# Patient Record
Sex: Male | Born: 1960 | Race: White | Hispanic: No | Marital: Single | Smoking: Never smoker
Health system: Southern US, Community
[De-identification: ages and names within clinical notes are randomized; demographics above are authoritative.]

## PROBLEM LIST (undated history)

## (undated) DIAGNOSIS — E1069 Type 1 diabetes mellitus with other specified complication: Secondary | ICD-10-CM

## (undated) DIAGNOSIS — E785 Hyperlipidemia, unspecified: Secondary | ICD-10-CM

## (undated) DIAGNOSIS — E119 Type 2 diabetes mellitus without complications: Secondary | ICD-10-CM

## (undated) DIAGNOSIS — F988 Other specified behavioral and emotional disorders with onset usually occurring in childhood and adolescence: Secondary | ICD-10-CM

## (undated) DIAGNOSIS — E1029 Type 1 diabetes mellitus with other diabetic kidney complication: Secondary | ICD-10-CM

## (undated) DIAGNOSIS — M4802 Spinal stenosis, cervical region: Secondary | ICD-10-CM

## (undated) HISTORY — DX: Spinal stenosis, cervical region: M48.02

## (undated) HISTORY — DX: Other specified behavioral and emotional disorders with onset usually occurring in childhood and adolescence: F98.8

## (undated) HISTORY — DX: Type 2 diabetes mellitus without complications: E11.9

## (undated) HISTORY — PX: MOUTH SURGERY: SHX715

## (undated) HISTORY — DX: Type 1 diabetes mellitus with other specified complication: E10.69

## (undated) HISTORY — DX: Type 1 diabetes mellitus with other diabetic kidney complication: E10.29

## (undated) HISTORY — DX: Hyperlipidemia, unspecified: E78.5

---

## 2000-02-24 ENCOUNTER — Encounter: Admission: RE | Admit: 2000-02-24 | Discharge: 2000-05-24 | Payer: Self-pay | Admitting: Internal Medicine

## 2009-04-03 HISTORY — PX: REFRACTIVE SURGERY: SHX103

## 2009-06-03 HISTORY — PX: NASAL SINUS SURGERY: SHX719

## 2009-09-03 HISTORY — PX: SHOULDER SURGERY: SHX246

## 2010-11-04 DIAGNOSIS — E109 Type 1 diabetes mellitus without complications: Secondary | ICD-10-CM | POA: Insufficient documentation

## 2013-03-10 ENCOUNTER — Other Ambulatory Visit: Payer: Self-pay

## 2013-03-10 MED ORDER — AMPHETAMINE-DEXTROAMPHETAMINE 20 MG PO TABS: 20.0000 mg | ORAL_TABLET | Freq: Two times a day (BID) | ORAL | Status: DC

## 2013-03-10 NOTE — Telephone Encounter (Signed)
Patient called requesting refill on Adderall.  Patient would like rx mailed when it is ready.

## 2013-04-29 ENCOUNTER — Other Ambulatory Visit: Payer: Self-pay

## 2013-04-29 MED ORDER — AMPHETAMINE-DEXTROAMPHETAMINE 20 MG PO TABS: 20.0000 mg | ORAL_TABLET | Freq: Two times a day (BID) | ORAL | Status: DC

## 2013-04-29 NOTE — Telephone Encounter (Signed)
Patient called to request a refill on Adderall.  He would like the Rx mailed to him when ready.

## 2013-04-30 NOTE — Telephone Encounter (Signed)
Rx Mailed.

## 2013-06-03 ENCOUNTER — Other Ambulatory Visit: Payer: Self-pay

## 2013-06-03 MED ORDER — AMPHETAMINE-DEXTROAMPHETAMINE 20 MG PO TABS: 20.0000 mg | ORAL_TABLET | Freq: Two times a day (BID) | ORAL | Status: DC

## 2013-06-03 NOTE — Telephone Encounter (Signed)
Patient called, left message requesting a refill on Adderall.  He would like the Rx mailed to him when it's ready.

## 2013-06-14 ENCOUNTER — Encounter: Payer: Self-pay | Admitting: Nurse Practitioner

## 2013-06-16 ENCOUNTER — Ambulatory Visit: Payer: Self-pay | Admitting: Nurse Practitioner

## 2013-07-01 ENCOUNTER — Other Ambulatory Visit: Payer: Self-pay

## 2013-07-01 MED ORDER — AMPHETAMINE-DEXTROAMPHETAMINE 20 MG PO TABS: 20.0000 mg | ORAL_TABLET | Freq: Two times a day (BID) | ORAL | Status: DC

## 2013-07-01 NOTE — Telephone Encounter (Signed)
Patient called requesting a refill on Adderall.  He would like the Rx mailed to him when it's ready.  

## 2013-07-02 NOTE — Telephone Encounter (Signed)
Rx Mailed.

## 2013-08-05 ENCOUNTER — Other Ambulatory Visit: Payer: Self-pay

## 2013-08-05 MED ORDER — AMPHETAMINE-DEXTROAMPHETAMINE 20 MG PO TABS: 20.0000 mg | ORAL_TABLET | Freq: Two times a day (BID) | ORAL | Status: DC

## 2013-08-05 NOTE — Telephone Encounter (Signed)
Patient called requesting a refill on Adderall.  He would like the Rx mailed to his new address [address] [place] [address] when it's ready.

## 2013-08-05 NOTE — Telephone Encounter (Signed)
Rx signed and mailed.

## 2013-09-16 ENCOUNTER — Other Ambulatory Visit: Payer: Self-pay

## 2013-09-16 MED ORDER — AMPHETAMINE-DEXTROAMPHETAMINE 20 MG PO TABS: 20.0000 mg | ORAL_TABLET | Freq: Two times a day (BID) | ORAL | Status: DC

## 2013-09-16 NOTE — Telephone Encounter (Signed)
Rx signed and mailed.

## 2013-09-16 NOTE — Telephone Encounter (Signed)
Patient called requesting a refill on Adderall.  He would like the Rx mailed to him when it's ready.  

## 2013-10-23 ENCOUNTER — Other Ambulatory Visit: Payer: Self-pay

## 2013-10-23 MED ORDER — AMPHETAMINE-DEXTROAMPHETAMINE 20 MG PO TABS: 20.0000 mg | ORAL_TABLET | Freq: Two times a day (BID) | ORAL | Status: DC

## 2013-10-23 NOTE — Telephone Encounter (Signed)
Patient called requesting a refill on Adderall.  He would like the Rx mailed to him when it's ready.  

## 2013-10-24 NOTE — Telephone Encounter (Signed)
Patient's prescription was mailed out today. °

## 2013-12-04 HISTORY — PX: FOOT FRACTURE SURGERY: SHX645

## 2013-12-10 ENCOUNTER — Other Ambulatory Visit: Payer: Self-pay

## 2013-12-10 MED ORDER — AMPHETAMINE-DEXTROAMPHETAMINE 20 MG PO TABS: 20.0000 mg | ORAL_TABLET | Freq: Two times a day (BID) | ORAL | Status: DC

## 2013-12-10 NOTE — Telephone Encounter (Signed)
Patient called requesting a refill on Adderall.  He would ike the Rx mailed when it is ready.

## 2013-12-11 NOTE — Telephone Encounter (Signed)
Rx was mailed out today.

## 2014-01-15 ENCOUNTER — Other Ambulatory Visit: Payer: Self-pay | Admitting: *Deleted

## 2014-01-15 MED ORDER — AMPHETAMINE-DEXTROAMPHETAMINE 20 MG PO TABS: 20.0000 mg | ORAL_TABLET | Freq: Two times a day (BID) | ORAL | Status: DC

## 2014-01-15 NOTE — Telephone Encounter (Signed)
Patient's Rx was mailed out today. I tried to call to inform the patient but # was not in service and patient was not at work today.

## 2014-03-23 ENCOUNTER — Other Ambulatory Visit: Payer: Self-pay | Admitting: *Deleted

## 2014-03-23 MED ORDER — AMPHETAMINE-DEXTROAMPHETAMINE 20 MG PO TABS: 20.0000 mg | ORAL_TABLET | Freq: Two times a day (BID) | ORAL | Status: DC

## 2014-03-23 NOTE — Telephone Encounter (Signed)
Patient called needing refill ofamphetamine-dextroamphetamine (ADDERALL) 20 MG tablet.  Patient states rx is normally mailed to his home.  Please call patient at earliest convenience.  Thanks

## 2014-03-23 NOTE — Telephone Encounter (Signed)
Pt's Rx was mailed out today. °

## 2014-03-23 NOTE — Telephone Encounter (Signed)
Has appt scheduled in June.

## 2014-04-13 ENCOUNTER — Encounter: Payer: Self-pay | Admitting: Nurse Practitioner

## 2014-05-19 ENCOUNTER — Ambulatory Visit: Payer: Self-pay | Admitting: Nurse Practitioner

## 2014-05-28 ENCOUNTER — Other Ambulatory Visit: Payer: Self-pay | Admitting: Nurse Practitioner

## 2014-05-28 MED ORDER — AMPHETAMINE-DEXTROAMPHETAMINE 20 MG PO TABS: 20.0000 mg | ORAL_TABLET | Freq: Two times a day (BID) | ORAL | Status: DC

## 2014-05-28 NOTE — Telephone Encounter (Signed)
Patient needing refill for amphetamine-dextroamphetamine (ADDERALL) 20 MG tablet.  Thanks

## 2014-05-28 NOTE — Telephone Encounter (Signed)
Patient requesting Rx mailed to home and to call when mailed.  Thanks

## 2014-05-28 NOTE — Telephone Encounter (Signed)
Patient has an appt in August  Request forwarded to Arasely Akkerman for approval

## 2014-05-29 NOTE — Telephone Encounter (Signed)
Pt's Rx was mailed out today. °

## 2014-06-29 ENCOUNTER — Telehealth: Payer: Self-pay | Admitting: *Deleted

## 2014-06-29 NOTE — Telephone Encounter (Signed)
Calling patient to r/s appointment, home phone was disconnected, tried work number left message with [name] to have him call us back to r/s his appointment on 07/07/14.

## 2014-07-01 ENCOUNTER — Encounter: Payer: Self-pay | Admitting: *Deleted

## 2014-07-01 NOTE — Telephone Encounter (Signed)
Tried calling patient again to r/s appointment, no response letter has been mailed out to the patient to call the office back and r/s his appointment with NP CM.

## 2014-07-02 ENCOUNTER — Telehealth: Payer: Self-pay | Admitting: *Deleted

## 2014-07-02 NOTE — Telephone Encounter (Signed)
Called patient again today, spoke with Spencer Burke at his job and was given a mobile number of [phone], tried calling this number left message to call the office back about his appointment on 07/07/14, appointment needs to be r/s.

## 2014-07-06 ENCOUNTER — Encounter: Payer: Self-pay | Admitting: *Deleted

## 2014-07-07 ENCOUNTER — Ambulatory Visit: Payer: Self-pay | Admitting: Nurse Practitioner

## 2014-07-08 NOTE — Telephone Encounter (Signed)
Patient never returned the call and did not show up for his appointment.

## 2014-07-16 ENCOUNTER — Other Ambulatory Visit: Payer: Self-pay | Admitting: Nurse Practitioner

## 2014-07-16 MED ORDER — AMPHETAMINE-DEXTROAMPHETAMINE 20 MG PO TABS: 20.0000 mg | ORAL_TABLET | Freq: Two times a day (BID) | ORAL | Status: DC

## 2014-07-16 NOTE — Telephone Encounter (Signed)
°  Patient requesting refill of Adderall  sent to his home  Best number to contact [phone] okay to leave a message

## 2014-07-16 NOTE — Telephone Encounter (Signed)
Patient has an appt scheduled in Nov.  I called and spoke with patient who says he will be in for appt in Nov.  Dr Anne HahnWillis is out of the office.  [facility].

## 2014-07-17 NOTE — Telephone Encounter (Signed)
Pt's Rx was mailed out today. °

## 2014-08-27 ENCOUNTER — Telehealth: Payer: Self-pay | Admitting: Neurology

## 2014-08-27 NOTE — Telephone Encounter (Signed)
Patient calling to request refill of generic Adderall, says he usually gets it mailed to him. Please call and advise.

## 2014-08-28 NOTE — Telephone Encounter (Signed)
This patient has not been see in > 1 year. He will need a RV.

## 2014-08-28 NOTE — Telephone Encounter (Signed)
Please advise previous note. Thanks  °

## 2014-09-03 ENCOUNTER — Encounter: Payer: Self-pay | Admitting: Adult Health

## 2014-09-04 ENCOUNTER — Ambulatory Visit: Payer: Self-pay | Admitting: Adult Health

## 2014-09-08 ENCOUNTER — Encounter: Payer: Self-pay | Admitting: Adult Health

## 2014-09-08 ENCOUNTER — Encounter (INDEPENDENT_AMBULATORY_CARE_PROVIDER_SITE_OTHER): Payer: Self-pay

## 2014-09-08 ENCOUNTER — Ambulatory Visit (INDEPENDENT_AMBULATORY_CARE_PROVIDER_SITE_OTHER): Payer: Managed Care, Other (non HMO) | Admitting: Adult Health

## 2014-09-08 VITALS — BP 116/63 | HR 61 | Ht 70.0 in | Wt 175.0 lb

## 2014-09-08 DIAGNOSIS — F988 Other specified behavioral and emotional disorders with onset usually occurring in childhood and adolescence: Secondary | ICD-10-CM | POA: Insufficient documentation

## 2014-09-08 DIAGNOSIS — F909 Attention-deficit hyperactivity disorder, unspecified type: Secondary | ICD-10-CM

## 2014-09-08 MED ORDER — AMPHETAMINE-DEXTROAMPHETAMINE 20 MG PO TABS: 20.0000 mg | ORAL_TABLET | Freq: Two times a day (BID) | ORAL | Status: DC

## 2014-09-08 NOTE — Patient Instructions (Signed)
Amphetamine; Dextroamphetamine tablets What is this medicine? AMPHETAMINE; DEXTROAMPHETAMINE(am FET a meen; dex troe am FET a meen) is used to treat attention-deficit hyperactivity disorder (ADHD). It may also be used for narcolepsy. Federal law prohibits giving this medicine to any person other than the person for whom it was prescribed. Do not share this medicine with anyone else. This medicine may be used for other purposes; ask your health care provider or pharmacist if you have questions. COMMON BRAND NAME(S): Adderall What should I tell my health care provider before I take this medicine? They need to know if you have any of these conditions: -anxiety or panic attacks -circulation problems in fingers and toes -glaucoma -hardening or blockages of the arteries or heart blood vessels -heart disease or a heart defect -high blood pressure -history of a drug or alcohol abuse problem -history of stroke -kidney disease -liver disease -mental illness -seizures -suicidal thoughts, plans, or attempt; a previous suicide attempt by you or a family member -thyroid disease -Tourette's syndrome -an unusual or allergic reaction to dextroamphetamine, other amphetamines, other medicines, foods, dyes, or preservatives -pregnant or trying to get pregnant -breast-feeding How should I use this medicine? Take this medicine by mouth with a glass of water. Follow the directions on the prescription label. Take your doses at regular intervals. Do not take your medicine more often than directed. Do not suddenly stop your medicine. You must gradually reduce the dose or you may feel withdrawal effects. Ask your doctor or health care professional for advice. Talk to your pediatrician regarding the use of this medicine in children. Special care may be needed. While this drug may be prescribed for children as young as 3 years for selected conditions, precautions do apply. Overdosage: If you think you have taken too  much of this medicine contact a poison control center or emergency room at once. NOTE: This medicine is only for you. Do not share this medicine with others. What if I miss a dose? If you miss a dose, take it as soon as you can. If it is almost time for your next dose, take only that dose. Do not take double or extra doses. What may interact with this medicine? Do not take this medicine with any of the following medications: -certain medicines for migraine headache like almotriptan, eletriptan, frovatriptan, naratriptan, rizatriptan, sumatriptan, zolmitriptan -MAOIS like Carbex, Eldepryl, Marplan, Nardil, and Parnate -meperidine -other stimulant medicines for attention disorders, weight loss, or to stay awake -pimozide -procarbazine This medicine may also interact with the following medications: -acetazolamide -ammonium chloride -antacids -ascorbic acid -atomoxetine -caffeine -certain medicines for blood pressure -certain medicines for depression, anxiety, or psychotic disturbances -certain medicines for seizures like carbamazepine, phenobarbital, phenytoin -certain medicines for stomach problems like cimetidine, famotidine, omeprazole, lansoprazole -cold or allergy medicines -glutamic acid -methenamine -narcotic medicines for pain -norepinephrine -phenothiazines like chlorpromazine, mesoridazine, prochlorperazine, thioridazine -sodium acid phosphate -sodium bicarbonate This list may not describe all possible interactions. Give your health care provider a list of all the medicines, herbs, non-prescription drugs, or dietary supplements you use. Also tell them if you smoke, drink alcohol, or use illegal drugs. Some items may interact with your medicine. What should I watch for while using this medicine? Visit your doctor or health care professional for regular checks on your progress. This prescription requires that you follow special procedures with your doctor and pharmacy. You will  need to have a new written prescription from your doctor every time you need a refill. This medicine may affect your   concentration, or hide signs of tiredness. Until you know how this medicine affects you, do not drive, ride a bicycle, use machinery, or do anything that needs mental alertness. Tell your doctor or health care professional if this medicine loses its effects, or if you feel you need to take more than the prescribed amount. Do not change the dosage without talking to your doctor or health care professional. Decreased appetite is a common side effect when starting this medicine. Eating small, frequent meals or snacks can help. Talk to your doctor if you continue to have poor eating habits. Height and weight growth of a child taking this medicine will be monitored closely. Do not take this medicine close to bedtime. It may prevent you from sleeping. If you are going to need surgery, a MRI, CT scan, or other procedure, tell your doctor that you are taking this medicine. You may need to stop taking this medicine before the procedure. Tell your doctor or healthcare professional right away if you notice unexplained wounds on your fingers and toes while taking this medicine. You should also tell your healthcare provider if you experience numbness or pain, changes in the skin color, or sensitivity to temperature in your fingers or toes. What side effects may I notice from receiving this medicine? Side effects that you should report to your doctor or health care professional as soon as possible: -allergic reactions like skin rash, itching or hives, swelling of the face, lips, or tongue -changes in vision -chest pain or chest tightness -confusion, trouble speaking or understanding -fast, irregular heartbeat -fingers or toes feel numb, cool, painful -hallucination, loss of contact with reality -high blood pressure -males: prolonged or painful erection -seizures -severe headaches -shortness of  breath -suicidal thoughts or other mood changes -trouble walking, dizziness, loss of balance or coordination -uncontrollable head, mouth, neck, arm, or leg movements Side effects that usually do not require medical attention (report to your doctor or health care professional if they continue or are bothersome): -anxious -headache -loss of appetite -nausea, vomiting -trouble sleeping -weight loss This list may not describe all possible side effects. Call your doctor for medical advice about side effects. You may report side effects to FDA at 1-800-FDA-1088. Where should I keep my medicine? Keep out of the reach of children. This medicine can be abused. Keep your medicine in a safe place to protect it from theft. Do not share this medicine with anyone. Selling or giving away this medicine is dangerous and against the law. Store at room temperature between 15 and 30 degrees C (59 and 86 degrees F). Keep container tightly closed. Throw away any unused medicine after the expiration date. NOTE: This sheet is a summary. It may not cover all possible information. If you have questions about this medicine, talk to your doctor, pharmacist, or health care provider.  2015, Elsevier/Gold Standard. (2013-10-03 18:16:55)  

## 2014-09-08 NOTE — Progress Notes (Signed)
PATIENT: Spencer Burke DOB: 09-07-1961  REASON FOR VISIT: follow up HISTORY FROM: patient  HISTORY OF PRESENT ILLNESS: Spencer Burke is a 53 year old male with a history of memory loss, ADD, polyneuropathy. He returns today for follow-up. He is currently taking adderall. Patient states that he had neuropsychological testing over 15 years ago that suggested that he might have learning disorder. Therefore he was started on adderall. He feels that the adderall has helped. His previous MOCA test was 25/30 in 2013. He does have a mother and uncle that has alzheimer's diease. He feels that his memory loss has remained the same. 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 He has good performance on the job. He does write articles but notices he has more difficulty writing his thoughts vs. Speaking them. He states that this is ongoing but denies it getting worse.   REVIEW OF SYSTEMS: Full 14 system review of systems performed and notable only for:  Constitutional: fatigue Eyes: blurred vision Ear/Nose/Throat: N/A  Skin: N/A  Cardiovascular: N/A  Respiratory: N/A  Gastrointestinal: N/A  Genitourinary: N/A Hematology/Lymphatic: N/A  Endocrine: N/A Musculoskeletal:N/A  Allergy/Immunology: N/A  Neurological: memory loss Psychiatric: decreased concentration Sleep: N/A   ALLERGIES: Allergies  Allergen Reactions  . Tetracyclines & Related     HOME MEDICATIONS: Outpatient Prescriptions Prior to Visit  Medication Sig Dispense Refill  . amphetamine-dextroamphetamine (ADDERALL) 20 MG tablet Take 1 tablet (20 mg total) by mouth 2 (two) times daily.  60 tablet  0  . aspirin 81 MG tablet Take 81 mg by mouth daily.      . Insulin Human (INSULIN PUMP) SOLN by Intravenous (Continuous Infusion) route. 5 units, Basal rate continous      . lisinopril (PRINIVIL,ZESTRIL) 10 MG tablet Take 10 mg by mouth daily.       No facility-administered  medications prior to visit.    PAST MEDICAL HISTORY: Past Medical History  Diagnosis Date  . Diabetes   . ADD (attention deficit disorder)   . HTN (hypertension)     PAST SURGICAL HISTORY: Past Surgical History  Procedure Laterality Date  . Shoulder surgery Left 09/2009  . Refractive surgery  04/2009  . Nasal sinus surgery  06/2009  . Mouth surgery      FAMILY HISTORY: Family History  Problem Relation Age of Onset  . Transient ischemic attack Mother   . Transient ischemic attack Father     SOCIAL HISTORY: History   Social History  . Marital Status: Single    Spouse Name: N/A    Number of Children: 2  . Years of Education: 12+   Occupational History  . Not on file.   Social History Main Topics  . Smoking status: Never Smoker   . Smokeless tobacco: Never Used  . Alcohol Use: No  . Drug Use: No  . Sexual Activity: Not on file   Other Topics Concern  . Not on file   Social History Narrative   Patient lives at home with wife.   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    Patient has 2 adopted children.    Patient is right handed.   Patient drinks 2 cups of caffeine daily.       PHYSICAL EXAM  Filed Vitals:   09/08/14 1127  BP: 116/63  Pulse: 61  Height: 5\' 10"  (1.778 m)  Weight: 175 lb (79.379 kg)   Body  mass index is 25.11 kg/(m^2).  Generalized: Well developed, in no acute distress   Neurological examination  Mentation: Alert oriented to time, place, history taking. Follows all commands speech and language fluent. MOCA 27/30 Cranial nerve II-XII: Pupils were equal round reactive to light. Extraocular movements were full, visual field were full on confrontational test. Facial sensation and strength were normal.Uvula tongue midline. Head turning and shoulder shrug  were normal and symmetric. Motor: The motor testing reveals 5 over 5 strength of all 4 extremities. Good symmetric motor tone is noted throughout.  Sensory: Sensory testing is intact  to soft touch on all 4 extremities. No evidence of extinction is noted.  Coordination: Cerebellar testing reveals good finger-nose-finger and heel-to-shin bilaterally.  Gait and station: Gait is normal. Tandem gait is normal. Romberg is negative. No drift is seen.  Reflexes: Deep tendon reflexes are symmetric and normal bilaterally.    DIAGNOSTIC DATA (LABS, IMAGING, TESTING) - I reviewed patient records, labs, notes, testing and imaging myself where available.   ASSESSMENT AND PLAN 53 y.o. year old male  has a past medical history of Diabetes; ADD (attention deficit disorder); and HTN (hypertension). here with:  1. attention deficit disorder  Patient was last seen in 2013. He was prescribed Adderall for attention deficit disorder after neuropsychological testing indicated that he may have a learning disorder. He does get benefit from the Adderall. I will refill this today. His moca score has improved since 2013. His score today was 27/30. We will continue to follow his memory over time. If the patient's symptoms worsen or he develops new symptoms he should let us know. Otherwise he will followup in 6 months or sooner if needed.   Spencer PennyMegan Kwadwo Taras, MSN, NP-C 09/08/2014, 11:47 AM [facility] [address], [address] [place], [place] [address] [phone]  Note: This document was prepared with digital dictation and possible smart phrase technology. Any transcriptional errors that result from this process are unintentional.

## 2014-09-08 NOTE — Progress Notes (Signed)
I have read the note, and I agree with the clinical assessment and plan.  Ambra Haverstick KEITH   

## 2014-10-06 ENCOUNTER — Ambulatory Visit: Payer: Self-pay | Admitting: Nurse Practitioner

## 2014-10-27 ENCOUNTER — Other Ambulatory Visit: Payer: Self-pay | Admitting: Neurology

## 2014-10-27 MED ORDER — AMPHETAMINE-DEXTROAMPHETAMINE 20 MG PO TABS: 20.0000 mg | ORAL_TABLET | Freq: Two times a day (BID) | ORAL | Status: DC

## 2014-10-27 NOTE — Telephone Encounter (Signed)
I called the patient to let them know their Rx for Adderall was ready for pickup. Patient was instructed to bring Photo ID.  Patient states that his Dad, Gerilyn NestleLem Hulse  DOB: [birth year]/33 will be picking up his Rx.  The front desk was notified.

## 2014-10-27 NOTE — Telephone Encounter (Signed)
Pt needs writted Rx for amphetamine-dextroamphetamine (ADDERALL) 20 MG tablet.  Patient states this is mailed to him.  Please cal and advise.

## 2014-10-27 NOTE — Telephone Encounter (Signed)
Request entered, forwarded to provider for approval.  

## 2014-12-15 ENCOUNTER — Other Ambulatory Visit: Payer: Self-pay | Admitting: Adult Health

## 2014-12-15 MED ORDER — AMPHETAMINE-DEXTROAMPHETAMINE 20 MG PO TABS: 20.0000 mg | ORAL_TABLET | Freq: Two times a day (BID) | ORAL | Status: DC

## 2014-12-15 NOTE — Telephone Encounter (Signed)
Request entered, forwarded to provider for approval.  

## 2014-12-15 NOTE — Telephone Encounter (Addendum)
I called the patient to let them know their Rx for Adderall was ready for pickup. Patient was instructed to bring Photo ID.  Spencer Burke, DOB [birth year] will be picking up the patients Rx per patients request.

## 2014-12-15 NOTE — Telephone Encounter (Signed)
Patient requesting Rx refill for amphetamine-dextroamphetamine (ADDERALL) 20 MG tablet and requesting Generic brand.  Please call when ready for pick up.

## 2015-02-08 ENCOUNTER — Other Ambulatory Visit: Payer: Self-pay | Admitting: Adult Health

## 2015-02-08 MED ORDER — AMPHETAMINE-DEXTROAMPHETAMINE 20 MG PO TABS: 20.0000 mg | ORAL_TABLET | Freq: Two times a day (BID) | ORAL | Status: DC

## 2015-02-08 NOTE — Telephone Encounter (Signed)
Request entered, forwarded to provider for approval.  

## 2015-02-08 NOTE — Telephone Encounter (Signed)
Patient requesting Rx refill for amphetamine-dextroamphetamine (ADDERALL) 20 MG tablet.  Requesting if he could pick up Rx on 3/8 [place] to [place]  Please call and advise.

## 2015-02-09 ENCOUNTER — Telehealth: Payer: Self-pay

## 2015-02-09 NOTE — Telephone Encounter (Signed)
Called patient and informed Rx ready for pick up at front desk. Patient verbalized understanding.  

## 2015-03-11 ENCOUNTER — Ambulatory Visit: Payer: Managed Care, Other (non HMO) | Admitting: Adult Health

## 2015-03-18 ENCOUNTER — Telehealth: Payer: Self-pay

## 2015-03-18 NOTE — Telephone Encounter (Signed)
Called patient and Cx his apt. With Megan 03-19-15. Explained to patient why he understood. Patient will call back to schedule. Patient can see Dr.Willis/CM  due to Megan's LOA. Patient in centricity .

## 2015-03-19 ENCOUNTER — Ambulatory Visit: Payer: Managed Care, Other (non HMO) | Admitting: Adult Health

## 2015-04-15 ENCOUNTER — Ambulatory Visit (INDEPENDENT_AMBULATORY_CARE_PROVIDER_SITE_OTHER): Payer: Managed Care, Other (non HMO) | Admitting: Nurse Practitioner

## 2015-04-15 ENCOUNTER — Encounter: Payer: Self-pay | Admitting: Nurse Practitioner

## 2015-04-15 VITALS — BP 118/70 | HR 68 | Ht 69.0 in | Wt 175.2 lb

## 2015-04-15 DIAGNOSIS — F909 Attention-deficit hyperactivity disorder, unspecified type: Secondary | ICD-10-CM

## 2015-04-15 DIAGNOSIS — F988 Other specified behavioral and emotional disorders with onset usually occurring in childhood and adolescence: Secondary | ICD-10-CM

## 2015-04-15 MED ORDER — AMPHETAMINE-DEXTROAMPHETAMINE 20 MG PO TABS: 20.0000 mg | ORAL_TABLET | Freq: Two times a day (BID) | ORAL | Status: DC

## 2015-04-15 NOTE — Patient Instructions (Signed)
Verified Adderall prescription with [facility] Rx Adderall 20 mg twice daily Follow-up in 6 months

## 2015-04-15 NOTE — Progress Notes (Signed)
[facility]  PATIENT: Spencer Burke DOB: 11-24-1961   REASON FOR VISIT: Follow-up for attention deficit disorder  HISTORY FROM: Patient    HISTORY OF PRESENT ILLNESS:[name] is a 54 year old male with a history of memory loss, ADD, polyneuropathy. He returns today for follow-up. He was last seen in this office by Spencer PennyMegan Millikan, NP 09/08/2014. He is currently taking adderall. Patient states that he had neuropsychological testing over 15 years ago that suggested that he might have learning disorder. Therefore he was started on adderall. He feels that the adderall has helped. His previous MOCA test was 25/30 in 2013. He does have a mother and uncle that has alzheimer's diease. He feels that his memory loss has remained the same. 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 He has good performance on the job. He does write articles but notices he has more difficulty writing his thoughts vs. Speaking them. He states that this is ongoing but denies it getting worse. He also is diabetic and has insulin pump. He returns for reevaluation. He is unsure of his Adderall dose and there are 2 different  doses in the computer    REVIEW OF SYSTEMS: Full 14 system review of systems performed and notable only for those listed, all others are neg:  Constitutional: neg  Cardiovascular: neg Ear/Nose/Throat: neg  Skin: neg Eyes: neg Respiratory: neg Gastroitestinal: neg  Hematology/Lymphatic: neg  Endocrine: neg Musculoskeletal:neg Allergy/Immunology: neg Neurological: Memory loss, ADD Psychiatric: neg Sleep : neg   ALLERGIES: Allergies  Allergen Reactions  . Tetracyclines & Related     HOME MEDICATIONS: Outpatient Prescriptions Prior to Visit  Medication Sig Dispense Refill  . aspirin 81 MG tablet Take 81 mg by mouth daily.    . Insulin Human (INSULIN PUMP) SOLN by Intravenous (Continuous Infusion) route. 5 units,  Basal rate continous    . lisinopril (PRINIVIL,ZESTRIL) 10 MG tablet Take 10 mg by mouth daily.    [name]. amphetamine-dextroamphetamine (ADDERALL) 20 MG tablet Take 1 tablet (20 mg total) by mouth 2 (two) times daily. (Patient not taking: Reported on 04/15/2015) 60 tablet 0   No facility-administered medications prior to visit.    PAST MEDICAL HISTORY: Past Medical History  Diagnosis Date  . Diabetes   . ADD (attention deficit disorder)     PAST SURGICAL HISTORY: Past Surgical History  Procedure Laterality Date  . Shoulder surgery Left 09/2009  . Refractive surgery  04/2009  . Nasal sinus surgery  06/2009  . Mouth surgery      FAMILY HISTORY: Family History  Problem Relation Age of Onset  . Transient ischemic attack Mother   . Transient ischemic attack Father     SOCIAL HISTORY: History   Social History  . Marital Status: Single    Spouse Name: N/A  . Number of Children: 2  . Years of Education: 12+   Occupational History  . Not on file.   Social History Main Topics  . Smoking status: Never Smoker   . Smokeless tobacco: Never Used  . Alcohol Use: No  . Drug Use: No  . Sexual Activity: Not on file   Other Topics Concern  . Not on file   Social History Narrative   Patient lives at home with wife.   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    Patient has 2 adopted children.    Patient is right handed.  Patient drinks 2 cups of caffeine daily.      PHYSICAL EXAM  Filed Vitals:   04/15/15 0937  BP: 118/70  Pulse: 68  Height: 5\' 9"  (1.753 m)  Weight: 175 lb 3.2 oz (79.47 kg)   Body mass index is 25.86 kg/(m^2). Generalized: Well developed, in no acute distress   Neurological examination  Mentation: Alert oriented to time, place, history taking. Follows all commands speech and language fluent. MOCA 30/30.AFT 11.  Cranial nerve II-XII: Pupils were equal round reactive to light. Extraocular movements were full, visual field were full on confrontational  test. Facial sensation and strength were normal.Uvula tongue midline. Head turning and shoulder shrug were normal and symmetric. Motor: The motor testing reveals 5 over 5 strength of all 4 extremities. Good symmetric motor tone is noted throughout.  Sensory: Sensory testing is intact to soft touch on all 4 extremities. No evidence of extinction is noted.  Coordination: Cerebellar testing reveals good finger-nose-finger and heel-to-shin bilaterally.  Gait and station: Gait is normal. Tandem gait is normal. Romberg is negative. No drift is seen.  Reflexes: Deep tendon reflexes are symmetric and normal bilaterally.    DIAGNOSTIC DATA (LABS, IMAGING, TESTING) - ASSESSMENT AND PLAN  54 y.o. year old male  has a past medical history of Diabetes and ADD (attention deficit disorder). here to follow-up. He is currently on Adderall. I called his pharmacy to verify his current prescription.  Continue Adderall  Rx Adderall 20 mg twice daily given to patient Follow-up in 6 months Spencer Burke, [facility] CenterGNP, [facility], APRN  [facility] [address], [address] [place], [place] [address] [phone]

## 2015-04-15 NOTE — Progress Notes (Signed)
I have read the note, and I agree with the clinical assessment and plan.  Deandra Goering KEITH   

## 2015-06-09 ENCOUNTER — Telehealth: Payer: Self-pay

## 2015-06-09 ENCOUNTER — Other Ambulatory Visit: Payer: Self-pay | Admitting: Neurology

## 2015-06-09 MED ORDER — AMPHETAMINE-DEXTROAMPHETAMINE 20 MG PO TABS: 20.0000 mg | ORAL_TABLET | Freq: Two times a day (BID) | ORAL | Status: DC

## 2015-06-09 NOTE — Telephone Encounter (Signed)
Last OV note says Continue Adderall 20 mg twice daily.  By reviewing the last 2 years of history, we have been prescribing this dose.  I called the patient back to clarify.  He confirmed he has been getting  tabs, says he thinks he used to get  several years ago.  Asked that we refill  tabs.  Request entered, forwarded to provider for approval.

## 2015-06-09 NOTE — Telephone Encounter (Signed)
Rx ready for pick up. 

## 2015-06-09 NOTE — Telephone Encounter (Signed)
Patient called requesting refill for amphetamine-dextroamphetamine (ADDERALL) 30 MG tablet . Patient advised RX will be ready within 24 hours unless otherwise informed by RN. Patient can be reached at [phone]

## 2015-08-12 ENCOUNTER — Other Ambulatory Visit: Payer: Self-pay | Admitting: Nurse Practitioner

## 2015-08-12 MED ORDER — AMPHETAMINE-DEXTROAMPHETAMINE 20 MG PO TABS: 20.0000 mg | ORAL_TABLET | Freq: Two times a day (BID) | ORAL | Status: DC

## 2015-08-12 NOTE — Telephone Encounter (Signed)
Request entered, forwarded to provider for approval.  

## 2015-08-12 NOTE — Telephone Encounter (Signed)
Patient is calling to get a written Rx for amphetamine-dextroamphetamine (ADDERALL) 20 MG tablet. I advised the Rx will be ready in 24 hours unless the nurse advises otherwise. Thank you. °

## 2015-08-13 ENCOUNTER — Telehealth: Payer: Self-pay | Admitting: *Deleted

## 2015-08-13 NOTE — Telephone Encounter (Signed)
Rx for Adderall placed up front for pick up.

## 2015-09-22 NOTE — Telephone Encounter (Signed)
See other 03-18-15 note by Darreld Mcleanana Genna.

## 2015-09-28 ENCOUNTER — Other Ambulatory Visit: Payer: Self-pay

## 2015-09-28 ENCOUNTER — Telehealth: Payer: Self-pay | Admitting: Adult Health

## 2015-09-28 MED ORDER — AMPHETAMINE-DEXTROAMPHETAMINE 20 MG PO TABS: 20.0000 mg | ORAL_TABLET | Freq: Two times a day (BID) | ORAL | Status: DC

## 2015-09-28 NOTE — Telephone Encounter (Signed)
It does not appear we prescribe this medication.  The only Rx we write is for Adderall.  I called back to clarify, got no answer.  Left message.

## 2015-09-28 NOTE — Telephone Encounter (Signed)
Pt needs refill on lisinopril (PRINIVIL,ZESTRIL) 10 MG tabl. Thank you

## 2015-09-29 ENCOUNTER — Telehealth: Payer: Self-pay

## 2015-09-29 NOTE — Telephone Encounter (Signed)
Rx ready for pick up. 

## 2015-10-18 ENCOUNTER — Encounter: Payer: Self-pay | Admitting: Nurse Practitioner

## 2015-10-18 ENCOUNTER — Ambulatory Visit (INDEPENDENT_AMBULATORY_CARE_PROVIDER_SITE_OTHER): Payer: Managed Care, Other (non HMO) | Admitting: Nurse Practitioner

## 2015-10-18 VITALS — BP 117/63 | HR 71 | Ht 69.0 in | Wt 171.6 lb

## 2015-10-18 DIAGNOSIS — E109 Type 1 diabetes mellitus without complications: Secondary | ICD-10-CM

## 2015-10-18 DIAGNOSIS — F988 Other specified behavioral and emotional disorders with onset usually occurring in childhood and adolescence: Secondary | ICD-10-CM

## 2015-10-18 DIAGNOSIS — F909 Attention-deficit hyperactivity disorder, unspecified type: Secondary | ICD-10-CM

## 2015-10-18 NOTE — Progress Notes (Signed)
[facility]  PATIENT: Spencer Burke DOB: [birth year]   REASON FOR VISIT follow-up for attention deficit disorder HISTORY FROM: Patient    HISTORY OF PRESENT ILLNESS:Spencer Burke is a 54 year old male with a history of memory loss, ADD, polyneuropathy. He returns today for follow-up. He was last seen in this office 04/15/15. He is currently taking adderall. Patient states that he had neuropsychological testing over 15 years ago that suggested that he might have learning disorder. Therefore he was started on adderall. He feels that the adderall has helped. His previous MOCA test was 25/30 in 2013. He does have a mother and uncle that has alzheimer's diease. He feels that his memory loss has remained the same. 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 Speaking them. He states that this is ongoing but denies it getting worse. He also is diabetic and has insulin pump. He returns for reevaluation.     REVIEW OF SYSTEMS: Full 14 system review of systems performed and notable only for those listed, all others are neg:  Constitutional: neg  Cardiovascular: neg Ear/Nose/Throat: neg  Skin: neg Eyes: neg Respiratory: neg Gastroitestinal: neg  Hematology/Lymphatic: neg  Endocrine: neg Musculoskeletal:neg Allergy/Immunology: Environmental allergies Neurological: neg Psychiatric: neg Sleep : neg   ALLERGIES: Allergies  Allergen Reactions  . Tetracyclines & Related     HOME MEDICATIONS: Outpatient Prescriptions Prior to Visit  Medication Sig Dispense Refill  . amphetamine-dextroamphetamine (ADDERALL) 20 MG tablet Take 1 tablet (20 mg total) by mouth 2 (two) times daily. 60 tablet 0  . aspirin 81 MG tablet Take 81 mg by mouth daily.    [name] glucose blood test strip CHECK BLOOD SUGAR 10-12 TIMES DAILY    . insulin aspart (NOVOLOG) 100 UNIT/ML injection USE IN  INSULIN PUMP AS DIRECTED, UP TO 65 UNITS EVERY DAY    . Insulin Human (INSULIN PUMP) SOLN by Intravenous (Continuous Infusion) route. 5 units, Basal rate continous    . lisinopril (PRINIVIL,ZESTRIL) 10 MG tablet TAKE ONE TABLET BY MOUTH DAILY (pt stated he is tritrating back up ot , taking 2.5mg  now)    . loratadine (CLARITIN) 10 MG tablet Take 5 mg by mouth as needed.    [name] aspirin 81 MG tablet Take 81 mg by mouth.    [name] lisinopril (PRINIVIL,ZESTRIL) 10 MG tablet Take 10 mg by mouth daily.     No facility-administered medications prior to visit.    PAST MEDICAL HISTORY: Past Medical History  Diagnosis Date  . Diabetes (HCC)   . ADD (attention deficit disorder)     PAST SURGICAL HISTORY: Past Surgical History  Procedure Laterality Date  . Shoulder surgery Left 09/2009  . Refractive surgery  04/2009  . Nasal sinus surgery  06/2009  . Mouth surgery    . Foot fracture surgery Left 2015    foot and ankle (foot this yr)    FAMILY HISTORY: Family History  Problem Relation Age of Onset  . Transient ischemic attack Mother   . Transient ischemic attack Father     SOCIAL HISTORY: Social History   Social History  . Marital Status: Single    Spouse Name: N/A  . Number of Children: 2  . Years of Education: 12+   Occupational History  . Not on file.   Social History Main Topics  . Smoking status: Never Smoker   . Smokeless  tobacco: Never Used  . Alcohol Use: No  . Drug Use: No  . Sexual Activity: Not on file   Other Topics Concern  . Not on file   Social History Narrative   Patient lives at home with wife.   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    Patient has 2 adopted children.    Patient is right handed.   Patient drinks 2 cups of caffeine daily.      PHYSICAL EXAM  Filed Vitals:   10/18/15 1127  BP: 117/63  Pulse: 71  Height: 5\' 9"  (1.753 m)  Weight: 171 lb 9.6 oz (77.837 kg)   Body mass index is 25.33 kg/(m^2). Generalized: Well developed, in  no acute distress   Neurological examination  Mentation: Alert oriented to time, place, history taking. Follows all commands speech and language fluent. MOCA  25/30. AFT 11. Last  30/30.  Cranial nerve II-XII: Pupils were equal round reactive to light. Extraocular movements were full, visual field were full on confrontational test. Facial sensation and strength were normal.Uvula tongue midline. Head turning and shoulder shrug were normal and symmetric. Motor: The motor testing reveals 5 over 5 strength of all 4 extremities. Good symmetric motor tone is noted throughout.  Sensory: Sensory testing is intact to soft touch on all 4 extremities. No evidence of extinction is noted.  Coordination: Cerebellar testing reveals good finger-nose-finger and heel-to-shin bilaterally.  Gait and station: Gait is normal. Tandem gait is normal. Romberg is negative. No drift is seen.  Reflexes: Deep tendon reflexes are symmetric and normal bilaterally.     DIAGNOSTIC DATA (LABS, IMAGING, TESTING)  ASSESSMENT AND PLAN  54 y.o. year old male  has a past medical history of Diabetes (HCC) and ADD (attention deficit disorder). here to follow-up.  Continue Adderall Rx Adderall 20 mg twice daily too early for refill Follow-up in 6 months Nilda RiggsNancy Carolyn Lexii Walsh, [facility]GNP, [facility], APRN  [facility] [address], [address] [place], [place] [address] [phone]

## 2015-10-18 NOTE — Progress Notes (Signed)
I have read the note, and I agree with the clinical assessment and plan.  Jersie Beel KEITH   

## 2015-10-18 NOTE — Patient Instructions (Signed)
Continue Adderall Rx Adderall 20 mg twice daily given to patient Follow-up in 6 months

## 2015-12-09 ENCOUNTER — Telehealth: Payer: Self-pay | Admitting: Nurse Practitioner

## 2015-12-09 ENCOUNTER — Telehealth: Payer: Self-pay | Admitting: *Deleted

## 2015-12-09 ENCOUNTER — Other Ambulatory Visit: Payer: Self-pay

## 2015-12-09 MED ORDER — AMPHETAMINE-DEXTROAMPHETAMINE 20 MG PO TABS: 20.0000 mg | ORAL_TABLET | Freq: Two times a day (BID) | ORAL | Status: DC

## 2015-12-09 NOTE — Telephone Encounter (Signed)
Request entered, forwarded to provider for approval.  

## 2015-12-09 NOTE — Telephone Encounter (Signed)
Rx for generic Adderall placed up front for patient pick up.

## 2015-12-09 NOTE — Telephone Encounter (Signed)
Pt called requesting refill for amphetamine-dextroamphetamine (ADDERALL) 20 MG tablet . Pt advised RX will be ready within 24 hrs unless otherwise informed by RN

## 2015-12-09 NOTE — Telephone Encounter (Signed)
Request has been approved by provider  

## 2016-02-24 ENCOUNTER — Telehealth: Payer: Self-pay | Admitting: Nurse Practitioner

## 2016-02-24 MED ORDER — AMPHETAMINE-DEXTROAMPHETAMINE 20 MG PO TABS: 20.0000 mg | ORAL_TABLET | Freq: Two times a day (BID) | ORAL | Status: DC

## 2016-02-24 NOTE — Telephone Encounter (Signed)
Pt called requesting refill for amphetamine-dextroamphetamine (ADDERALL) 20 MG tablet °

## 2016-02-24 NOTE — Telephone Encounter (Signed)
Patient informed adderall rx is ready for pick up.  Lem patients father will pick up adderall rx.

## 2016-02-24 NOTE — Telephone Encounter (Signed)
Rx printed he will have to pick up. It is controlled

## 2016-04-04 ENCOUNTER — Other Ambulatory Visit: Payer: Self-pay | Admitting: Nurse Practitioner

## 2016-04-04 MED ORDER — AMPHETAMINE-DEXTROAMPHETAMINE 20 MG PO TABS: 20.0000 mg | ORAL_TABLET | Freq: Two times a day (BID) | ORAL | Status: DC

## 2016-04-04 NOTE — Telephone Encounter (Signed)
Pt requesting a refill on   amphetamine-dextroamphetamine (ADDERALL) 20 MG tablet       he would like a call to let him know if it become ready today. [place]. Thank you

## 2016-04-06 NOTE — Telephone Encounter (Signed)
Called Patient and left him a message relaying his RX is ready to be picked up Adderall relayed office hours on voicemail.

## 2016-04-17 ENCOUNTER — Ambulatory Visit (INDEPENDENT_AMBULATORY_CARE_PROVIDER_SITE_OTHER): Payer: Managed Care, Other (non HMO) | Admitting: Nurse Practitioner

## 2016-04-17 ENCOUNTER — Encounter: Payer: Self-pay | Admitting: Nurse Practitioner

## 2016-04-17 VITALS — BP 134/70 | HR 76 | Ht 69.0 in | Wt 170.0 lb

## 2016-04-17 DIAGNOSIS — E109 Type 1 diabetes mellitus without complications: Secondary | ICD-10-CM | POA: Diagnosis not present

## 2016-04-17 DIAGNOSIS — F909 Attention-deficit hyperactivity disorder, unspecified type: Secondary | ICD-10-CM

## 2016-04-17 DIAGNOSIS — F988 Other specified behavioral and emotional disorders with onset usually occurring in childhood and adolescence: Secondary | ICD-10-CM

## 2016-04-17 NOTE — Patient Instructions (Signed)
Continue Adderall Rx Adderall 20 mg twice daily  Follow-up in 6 months

## 2016-04-17 NOTE — Progress Notes (Signed)
[facility]  PATIENT: Raudel Bazen Leiber DOB: 1961/11/04   REASON FOR VISIT: Follow-up for attention deficit disorder HISTORY FROM: Patient    HISTORY OF PRESENT ILLNESS:Mr. Strehle is a 55year old male with a history of memory loss, ADD, polyneuropathy. He returns today for follow-up. He was last seen in this office 10/18/15.  He is currently taking adderall. Patient states that he had neuropsychological testing over 15 years ago that suggested that he might have learning disorder. Therefore he was started on adderall. He feels that the adderall has helped. His previous MOCA test was 25/30 in 2013. He does have a mother and uncle that has alzheimer's diease. He feels that his memory loss has remained the same. 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 Speaking them. He states that this is ongoing but denies it getting worse. He also is diabetic and has insulin pump. He has been recently divorced and one adult child has moved back to live with them. These are additional stressors. He does not want medication for depression. He was seeing a counselor at one time but that has stopped He returns for reevaluation.    REVIEW OF SYSTEMS: Full 14 system review of systems performed and notable only for those listed, all others are neg:  Constitutional: neg  Cardiovascular: neg Ear/Nose/Throat: neg  Skin: neg Eyes: neg Respiratory: neg Gastroitestinal: neg  Hematology/Lymphatic: neg  Endocrine: neg Musculoskeletal:neg Allergy/Immunology: neg Neurological: neg Psychiatric: neg Sleep : neg   ALLERGIES: Allergies  Allergen Reactions  . Tetracyclines & Related     HOME MEDICATIONS: Outpatient Prescriptions Prior to Visit  Medication Sig Dispense Refill  . amphetamine-dextroamphetamine (ADDERALL) 20 MG tablet Take 1 tablet (20 mg total) by mouth 2 (two) times  daily. 60 tablet 0  . aspirin 81 MG tablet Take 81 mg by mouth daily.    [name] glucose blood test strip CHECK BLOOD SUGAR 10-12 TIMES DAILY    . insulin aspart (NOVOLOG) 100 UNIT/ML injection USE IN INSULIN PUMP AS DIRECTED, UP TO 65 UNITS EVERY DAY    . Insulin Human (INSULIN PUMP) SOLN by Intravenous (Continuous Infusion) route. 5 units, Basal rate continous    . lisinopril (PRINIVIL,ZESTRIL) 10 MG tablet TAKE ONE TABLET BY MOUTH DAILY (pt stated he is tritrating back up ot , taking 2.5mg  now)    . loratadine (CLARITIN) 10 MG tablet Take 5 mg by mouth as needed.     No facility-administered medications prior to visit.    PAST MEDICAL HISTORY: Past Medical History  Diagnosis Date  . Diabetes (HCC)   . ADD (attention deficit disorder)     PAST SURGICAL HISTORY: Past Surgical History  Procedure Laterality Date  . Shoulder surgery Left 09/2009  . Refractive surgery  04/2009  . Nasal sinus surgery  06/2009  . Mouth surgery    . Foot fracture surgery Left 2015    foot and ankle (foot this yr)    FAMILY HISTORY: Family History  Problem Relation Age of Onset  . Transient ischemic attack Mother   . Transient ischemic attack Father     SOCIAL HISTORY: Social History   Social History  . Marital Status: Single    Spouse Name: N/A  . Number of Children: 2  . Years of Education: 12+   Occupational History  . Not on file.   Social History Main Topics  .  Smoking status: Never Smoker   . Smokeless tobacco: Never Used  . Alcohol Use: No  . Drug Use: No  . Sexual Activity: Not on file   Other Topics Concern  . Not on file   Social History Narrative   Patient lives at home with wife.   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    Patient has 2 adopted children.    Patient is right handed.   Patient drinks 2 cups of caffeine daily.      PHYSICAL EXAM  Filed Vitals:   04/17/16 1304  Height: 5\' 9"  (1.753 m)  Weight: 170 lb (77.111 kg)   Body mass index is 25.09  kg/(m^2). Generalized: Well developed, in no acute distress   Neurological examination  Mentation: Alert oriented to time, place, history taking. Follows all commands speech and language fluent. MOCA 25/30. AFT 17Last 25/30. This is unchanged for several years Cranial nerve II-XII: Pupils were equal round reactive to light. Extraocular movements were full, visual field were full on confrontational test. Facial sensation and strength were normal.Uvula tongue midline. Head turning and shoulder shrug were normal and symmetric. Motor: The motor testing reveals 5 over 5 strength of all 4 extremities. Good symmetric motor tone is noted throughout.  Sensory: Sensory testing is intact to soft touch on all 4 extremities. No evidence of extinction is noted.  Coordination: Cerebellar testing reveals good finger-nose-finger and heel-to-shin bilaterally.  Gait and station: Gait is normal. Tandem gait is normal. Romberg is negative. No drift is seen.  Reflexes: Deep tendon reflexes are symmetric and normal bilaterally.    DIAGNOSTIC DATA (LABS, IMAGING, TESTING)  ASSESSMENT AND PLAN 55 y.o. year old male  has a past medical history of Diabetes (HCC) and ADD (attention deficit disorder). here to follow-up.  Continue Adderall Rx Adderall 20 mg twice daily Rx was just picked up on 04/07/16 Suggest he see a counselor for the additional stressors of divorce, and adult child returning to live with him Follow-up in 6 months Nilda RiggsNancy Carolyn Courtney Fenlon, [facility]GNP, [facility]BC, APRN  [facility] [address], [address] [place], [place] [address] [phone]

## 2016-04-17 NOTE — Progress Notes (Signed)
I have read the note, and I agree with the clinical assessment and plan.  Latifah Padin KEITH   

## 2016-06-08 ENCOUNTER — Other Ambulatory Visit: Payer: Self-pay | Admitting: Nurse Practitioner

## 2016-06-08 MED ORDER — AMPHETAMINE-DEXTROAMPHETAMINE 20 MG PO TABS: 20.0000 mg | ORAL_TABLET | Freq: Two times a day (BID) | ORAL | Status: DC

## 2016-06-08 NOTE — Telephone Encounter (Signed)
Rx printed, signed, up front for pick-up. 

## 2016-06-08 NOTE — Telephone Encounter (Signed)
Patient requesting refill of amphetamine-dextroamphetamine (ADDERALL) 20 MG tablet. I advised the Rx will be ready in 24 hours unless the nurse advises otherwise. Patient will call before coming to pick up.

## 2016-09-05 ENCOUNTER — Other Ambulatory Visit: Payer: Self-pay | Admitting: Nurse Practitioner

## 2016-09-05 MED ORDER — AMPHETAMINE-DEXTROAMPHETAMINE 20 MG PO TABS: 20.0000 mg | ORAL_TABLET | Freq: Two times a day (BID) | ORAL | 0 refills | Status: DC

## 2016-09-05 NOTE — Telephone Encounter (Signed)
Patient called to request refill of amphetamine-dextroamphetamine (ADDERALL) 20 MG tablet °

## 2016-09-06 NOTE — Telephone Encounter (Signed)
LMVM for pt on mobile, that prescription ready for pick up at front desk.

## 2016-10-16 ENCOUNTER — Other Ambulatory Visit: Payer: Self-pay | Admitting: Nurse Practitioner

## 2016-10-16 MED ORDER — AMPHETAMINE-DEXTROAMPHETAMINE 20 MG PO TABS: 20.0000 mg | ORAL_TABLET | Freq: Two times a day (BID) | ORAL | 0 refills | Status: DC

## 2016-10-16 NOTE — Telephone Encounter (Signed)
Rx printed, signed, up front for pick-up. 

## 2016-10-16 NOTE — Telephone Encounter (Signed)
Pt request refill for amphetamine-dextroamphetamine (ADDERALL) 20 MG tablet °

## 2016-10-23 ENCOUNTER — Encounter: Payer: Self-pay | Admitting: Nurse Practitioner

## 2016-10-23 ENCOUNTER — Ambulatory Visit (INDEPENDENT_AMBULATORY_CARE_PROVIDER_SITE_OTHER): Payer: Managed Care, Other (non HMO) | Admitting: Nurse Practitioner

## 2016-10-23 VITALS — BP 110/68 | HR 72 | Ht 69.0 in | Wt 166.0 lb

## 2016-10-23 DIAGNOSIS — F909 Attention-deficit hyperactivity disorder, unspecified type: Secondary | ICD-10-CM

## 2016-10-23 NOTE — Progress Notes (Signed)
I have read the note, and I agree with the clinical assessment and plan.  Neel Buffone KEITH   

## 2016-10-23 NOTE — Patient Instructions (Signed)
Continue Adderall Rx Adderall 20 mg twice daily Rx was just picked up on 10/23/16 Follow-up in 6 months

## 2016-10-23 NOTE — Progress Notes (Signed)
[facility]  PATIENT: Spencer ArtisWilliam H Burke DOB: 05-17-1961   REASON FOR VISIT: Follow-up for attention deficit disorder HISTORY FROM: Patient    HISTORY OF PRESENT ILLNESS:[name] is a 55year old male with a history of memory loss, ADD, polyneuropathy. He returns today for follow-up. He was last seen in this office 04/17/16.He is currently taking adderall. Patient states that he had neuropsychological testing over 15 years ago that suggested that he might have learning disorder. Therefore he was started on adderall. He feels that the adderall has helped.  He does have a mother and uncle that has alzheimer's diease. He feels that his memory loss has remained the same. 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 He has good performance on the job. He does write articles but notices he has more difficulty writing his thoughts vs. Speaking them. He states that this is ongoing but denies it getting worse. He also is diabetic and has insulin pump. He has been recently divorced.He was seeing a counselor at one time but that has stopped He returns for reevaluation.    REVIEW OF SYSTEMS: Full 14 system review of systems performed and notable only for those listed, all others are neg:  Constitutional: neg  Cardiovascular: neg Ear/Nose/Throat: neg  Skin: neg Eyes: neg Respiratory: neg Gastroitestinal: neg  Hematology/Lymphatic: neg  Endocrine: neg Musculoskeletal:neg Allergy/Immunology: neg Neurological: neg Psychiatric: neg Sleep : neg   ALLERGIES: Allergies  Allergen Reactions  . Tetracyclines & Related     HOME MEDICATIONS: Outpatient Medications Prior to Visit  Medication Sig Dispense Refill  . amphetamine-dextroamphetamine (ADDERALL) 20 MG tablet Take 1 tablet (20 mg total) by mouth 2 (two) times daily. 60 tablet 0  . aspirin 81 MG tablet Take 81 mg by mouth daily.    [name]. glucose blood test strip CHECK BLOOD SUGAR 10-12 TIMES DAILY    . Insulin Human (INSULIN PUMP)  SOLN by Intravenous (Continuous Infusion) route. 5 units, Basal rate continous    . insulin lispro (HUMALOG) 100 UNIT/ML injection Inject into the skin. Use in insulin pump as directed.  Up to 65 units every day.    . lisinopril (PRINIVIL,ZESTRIL) 10 MG tablet TAKE ONE TABLET BY MOUTH DAILY (pt stated he is tritrating back up ot 10mg , taking 5mg  now)    . loratadine (CLARITIN) 10 MG tablet Take 5 mg by mouth as needed.     No facility-administered medications prior to visit.     PAST MEDICAL HISTORY: Past Medical History:  Diagnosis Date  . ADD (attention deficit disorder)   . Diabetes (HCC)     PAST SURGICAL HISTORY: Past Surgical History:  Procedure Laterality Date  . FOOT FRACTURE SURGERY Left 2015   foot and ankle (foot this yr)  . MOUTH SURGERY    . NASAL SINUS SURGERY  06/2009  . REFRACTIVE SURGERY  04/2009  . SHOULDER SURGERY Left 09/2009    FAMILY HISTORY: Family History  Problem Relation Age of Onset  . Transient ischemic attack Mother   . Transient ischemic attack Father     SOCIAL HISTORY: Social History   Social History  . Marital status: Single    Spouse name: N/A  . Number of children: 2  . Years of education: 12+   Occupational History  . Not on file.   Social History Main Topics  . Smoking status: Never Smoker  . Smokeless tobacco: Never Used  . Alcohol use No  . Drug use: No  . Sexual activity: Not on  file   Other Topics Concern  . Not on file   Social History Narrative   Patient lives at home with wife.   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    Patient has 2 adopted children.    Patient is right handed.   Patient drinks 2 cups of caffeine daily.      PHYSICAL EXAM  Vitals:   10/23/16 1415  BP: 110/68  Pulse: 72  Weight: 166 lb (75.3 kg)  Height: 5\' 9"  (1.753 m)   Body mass index is 24.51 kg/m. Generalized: Well developed, in no acute distress   Neurological examination  Mentation: Alert oriented to time, place,  history taking. Follows all commands speech and language fluent. MOCA not repeated MOCA 25/30. AFT 17. No change in several years Cranial nerve II-XII: Pupils were equal round reactive to light. Extraocular movements were full, visual field were full on confrontational test. Facial sensation and strength were normal.Uvula tongue midline. Head turning and shoulder shrug were normal and symmetric. Motor: The motor testing reveals 5 over 5 strength of all 4 extremities. Good symmetric motor tone is noted throughout.  Sensory: Sensory testing is intact to soft touch on all 4 extremities. No evidence of extinction is noted.  Coordination: Cerebellar testing reveals good finger-nose-finger and heel-to-shin bilaterally.  Gait and station: Gait is normal. Tandem gait is normal. Romberg is negative. No drift is seen.  Reflexes: Deep tendon reflexes are symmetric and normal bilaterally.    DIAGNOSTIC DATA (LABS, IMAGING, TESTING)  ASSESSMENT AND PLAN 55 y.o. year old male  has a past medical history of Diabetes (HCC) and ADD (attention deficit disorder). here to follow-up.  Continue Adderall Rx Adderall 20 mg twice daily Rx was just picked up on 10/23/16 Follow-up in 6 months Nilda RiggsNancy Carolyn Martin, [facility]GNP, [facility]BC, APRN  [facility] [address], [address] [place], [place] [address] [phone]

## 2016-12-29 ENCOUNTER — Other Ambulatory Visit: Payer: Self-pay | Admitting: Nurse Practitioner

## 2016-12-29 MED ORDER — AMPHETAMINE-DEXTROAMPHETAMINE 20 MG PO TABS: 20.0000 mg | ORAL_TABLET | Freq: Two times a day (BID) | ORAL | 0 refills | Status: DC

## 2016-12-29 NOTE — Addendum Note (Signed)
Addended byHermenia Fiscal: Dashan Chizmar on: 12/29/2016 10:16 AM   Modules accepted: Orders

## 2016-12-29 NOTE — Telephone Encounter (Signed)
Patient requesting refill for amphetamine-dextroamphetamine (ADDERALL) 20 MG tablet.

## 2016-12-29 NOTE — Telephone Encounter (Signed)
[place] Monday [place] DPR in [place].  Spoke to pt. Placed up front.

## 2017-04-16 ENCOUNTER — Telehealth: Payer: Self-pay | Admitting: Nurse Practitioner

## 2017-04-16 MED ORDER — AMPHETAMINE-DEXTROAMPHETAMINE 20 MG PO TABS: 20.0000 mg | ORAL_TABLET | Freq: Two times a day (BID) | ORAL | 0 refills | Status: DC

## 2017-04-16 NOTE — Addendum Note (Signed)
Addended by: York SpanielWILLIS, Rivaan Kendall K on: 04/16/2017 04:56 PM   Modules accepted: Orders

## 2017-04-16 NOTE — Telephone Encounter (Signed)
Patient requesting refill of amphetamine-dextroamphetamine (ADDERALL) 20 MG tablet. ° ° °

## 2017-04-16 NOTE — Telephone Encounter (Signed)
The prescription for Adderall will be given. 

## 2017-04-17 NOTE — Telephone Encounter (Signed)
Up at the front desk ready for pick up

## 2017-04-23 ENCOUNTER — Ambulatory Visit: Payer: Managed Care, Other (non HMO) | Admitting: Nurse Practitioner

## 2017-04-23 DIAGNOSIS — Z0289 Encounter for other administrative examinations: Secondary | ICD-10-CM

## 2017-04-24 ENCOUNTER — Encounter: Payer: Self-pay | Admitting: Nurse Practitioner

## 2017-04-24 ENCOUNTER — Ambulatory Visit (INDEPENDENT_AMBULATORY_CARE_PROVIDER_SITE_OTHER): Payer: Managed Care, Other (non HMO) | Admitting: Nurse Practitioner

## 2017-04-24 VITALS — BP 119/69 | HR 73 | Ht 69.0 in | Wt 168.2 lb

## 2017-04-24 DIAGNOSIS — F909 Attention-deficit hyperactivity disorder, unspecified type: Secondary | ICD-10-CM | POA: Diagnosis not present

## 2017-04-24 NOTE — Progress Notes (Signed)
[facility]  PATIENT: Spencer Burke DOB: 10-27-1961   REASON FOR VISIT: Follow-up for attention deficit disorder HISTORY FROM: Patient    HISTORY OF PRESENT ILLNESS:Spencer Burke is a 56year old male with a history of memory loss, ADD, polyneuropathy. He returns today for follow-up. Marland KitchenHe is currently taking adderall  20 mg 2 times daily. Patient states that he had neuropsychological testing over 15 years ago that suggested that he might have learning disorder. Therefore he was started on adderall. He feels that the adderall has helped.  He does have a mother and uncle that has alzheimer's diease. He feels that his memory loss has remained the same. 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 He does write articles but notices he has more difficulty writing his thoughts vs. Speaking them. He states that this is ongoing but denies it getting worse. He also is diabetic and has insulin pump. He returns for reevaluation.    REVIEW OF SYSTEMS: Full 14 system review of systems performed and notable only for those listed, all others are neg:  Constitutional: neg  Cardiovascular: neg Ear/Nose/Throat: neg  Skin: neg Eyes: neg Respiratory: neg Gastroitestinal: neg  Hematology/Lymphatic: neg  Endocrine: neg Musculoskeletal:neg Allergy/Immunology: neg Neurological: neg Psychiatric: neg Sleep : neg   ALLERGIES: Allergies  Allergen Reactions  . Tetracyclines & Related     HOME MEDICATIONS: Outpatient Medications Prior to Visit  Medication Sig Dispense Refill  . amphetamine-dextroamphetamine (ADDERALL) 20 MG tablet Take 1 tablet (20 mg total) by mouth 2 (two) times daily. 60 tablet 0  . aspirin 81 MG tablet Take 81 mg by mouth daily.    [name] glucose blood test strip CHECK BLOOD SUGAR 10-12 TIMES DAILY    . Insulin Human (INSULIN PUMP) SOLN by Intravenous (Continuous Infusion) route. 5 units, Basal rate continous    . insulin lispro  (HUMALOG) 100 UNIT/ML injection Inject into the skin. Use in insulin pump as directed.  Up to 65 units every day.    . lisinopril (PRINIVIL,ZESTRIL) 10 MG tablet TAKE ONE TABLET BY MOUTH DAILY (pt stated he is tritrating back up ot , taking  now)    . loratadine (CLARITIN) 10 MG tablet Take 5 mg by mouth as needed.     No facility-administered medications prior to visit.     PAST MEDICAL HISTORY: Past Medical History:  Diagnosis Date  . ADD (attention deficit disorder)   . Diabetes (HCC)     PAST SURGICAL HISTORY: Past Surgical History:  Procedure Laterality Date  . FOOT FRACTURE SURGERY Left 2015   foot and ankle (foot this yr)  . MOUTH SURGERY    . NASAL SINUS SURGERY  06/2009  . REFRACTIVE SURGERY  04/2009  . SHOULDER SURGERY Left 09/2009    FAMILY HISTORY: Family History  Problem Relation Age of Onset  . Transient ischemic attack Mother   . Transient ischemic attack Father     SOCIAL HISTORY: Social History   Social History  . Marital status: Single    Spouse name: N/A  . Number of children: 2  . Years of education: 12+   Occupational History  . Not on file.   Social History Main Topics  . Smoking status: Never Smoker  . Smokeless tobacco: Never Used  . Alcohol use No  . Drug use: No  . Sexual activity: Not on file   Other Topics Concern  . 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    Patient has 2 adopted children.    Patient is right handed.   Patient drinks 2 cups of caffeine daily.      PHYSICAL EXAM  Vitals:   04/24/17 1414  BP: 119/69  Pulse: 73  Weight: 168 lb 3.2 oz (76.3 kg)  Height: 5\' 9"  (1.753 m)   Body mass index is 24.84 kg/m. Generalized: Well developed, in no acute distress   Neurological examination  Mentation: Alert oriented to time, place, history taking. Follows all commands speech and language fluent.  Cranial nerve II-XII: Pupils  were equal round reactive to light. Extraocular movements were full, visual field were full on confrontational test. Facial sensation and strength were normal.Uvula tongue midline. Head turning and shoulder shrug were normal and symmetric. Motor: The motor testing reveals 5 over 5 strength of all 4 extremities. Good symmetric motor tone is noted throughout.  Sensory: Sensory testing is intact to soft touch on all 4 extremities. No evidence of extinction is noted.  Coordination: Cerebellar testing reveals good finger-nose-finger and heel-to-shin bilaterally.  Gait and station: Gait is normal. Tandem gait is normal. Romberg is negative. No drift is seen.  Reflexes: Deep tendon reflexes are symmetric and normal bilaterally.    DIAGNOSTIC DATA (LABS, IMAGING, TESTING)  ASSESSMENT AND PLAN 56 y.o. year old male  has a past medical history of Diabetes (HCC) and ADD (attention deficit disorder). here to follow-up.  Continue Adderall Rx Adderall 20 mg twice daily Rx was just picked up today Follow-up in 6 months Nilda RiggsNancy Carolyn Jaxen Samples, [facility], [facility], APRN  [facility] [address], [address] [place], [place] [id number] [phone]

## 2017-04-24 NOTE — Patient Instructions (Addendum)
Continue Adderall Rx Adderall 20 mg twice daily Rx was just picked up today Follow-up in6 mo

## 2017-04-24 NOTE — Progress Notes (Signed)
I have read the note, and I agree with the clinical assessment and plan.  WILLIS,CHARLES KEITH   

## 2017-06-21 ENCOUNTER — Telehealth: Payer: Self-pay | Admitting: Nurse Practitioner

## 2017-06-21 MED ORDER — AMPHETAMINE-DEXTROAMPHETAMINE 20 MG PO TABS: 20.0000 mg | ORAL_TABLET | Freq: Two times a day (BID) | ORAL | 0 refills | Status: DC

## 2017-06-21 NOTE — Telephone Encounter (Signed)
The prescription for Adderall was given.

## 2017-06-21 NOTE — Addendum Note (Signed)
Addended by: York SpanielWILLIS, Khiana Camino K on: 06/21/2017 05:16 PM   Modules accepted: Orders

## 2017-06-21 NOTE — Telephone Encounter (Signed)
Pt calling for refill of amphetamine-dextroamphetamine (ADDERALL) 20 MG tablet °

## 2017-06-22 NOTE — Telephone Encounter (Signed)
Placed printed/signed rx adderall up front for patient pick up.  

## 2017-08-16 ENCOUNTER — Telehealth: Payer: Self-pay | Admitting: Nurse Practitioner

## 2017-08-16 MED ORDER — AMPHETAMINE-DEXTROAMPHETAMINE 20 MG PO TABS: 20.0000 mg | ORAL_TABLET | Freq: Two times a day (BID) | ORAL | 0 refills | Status: DC

## 2017-08-16 NOTE — Telephone Encounter (Signed)
Placed printed/signed rx up front for patient pick up.  

## 2017-08-16 NOTE — Addendum Note (Signed)
Addended by: York SpanielWILLIS, CHARLES K on: 08/16/2017 11:29 AM   Modules accepted: Orders

## 2017-08-16 NOTE — Telephone Encounter (Signed)
The prescription for Adderall will be refilled. 
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
## 2017-11-19 ENCOUNTER — Other Ambulatory Visit: Payer: Self-pay | Admitting: Nurse Practitioner

## 2017-11-19 NOTE — Telephone Encounter (Signed)
Pt request refill for amphetamine-dextroamphetamine (ADDERALL) 20 MG tablet °

## 2017-11-20 MED ORDER — AMPHETAMINE-DEXTROAMPHETAMINE 20 MG PO TABS: 20.0000 mg | ORAL_TABLET | Freq: Two times a day (BID) | ORAL | 0 refills | Status: DC

## 2017-11-20 NOTE — Telephone Encounter (Signed)
Patient gets 3 months at a time.

## 2017-11-20 NOTE — Telephone Encounter (Signed)
Placed printed/signed rx up front for patient pick up.  

## 2017-12-25 ENCOUNTER — Telehealth: Payer: Self-pay | Admitting: Nurse Practitioner

## 2017-12-25 MED ORDER — AMPHETAMINE-DEXTROAMPHETAMINE 20 MG PO TABS: 20.0000 mg | ORAL_TABLET | Freq: Two times a day (BID) | ORAL | 0 refills | Status: DC

## 2017-12-25 NOTE — Telephone Encounter (Signed)
Pt request refill for amphetamine-dextroamphetamine (ADDERALL) 20 MG tablet °

## 2017-12-25 NOTE — Telephone Encounter (Signed)
Adderall refill Rx at front desk for pick up. 

## 2018-03-14 ENCOUNTER — Other Ambulatory Visit: Payer: Self-pay | Admitting: Nurse Practitioner

## 2018-03-14 MED ORDER — AMPHETAMINE-DEXTROAMPHETAMINE 20 MG PO TABS: 20.0000 mg | ORAL_TABLET | Freq: Two times a day (BID) | ORAL | 0 refills | Status: DC

## 2018-03-14 NOTE — Telephone Encounter (Signed)
Pt requesting a refill for amphetamine-dextroamphetamine (ADDERALL) 20 MG tablet [facility]

## 2018-04-24 NOTE — Progress Notes (Deleted)
[facility]  PATIENT: Spencer Burke DOB: 10-27-1961   REASON FOR VISIT: Follow-up for attention deficit disorder HISTORY FROM: Patient    HISTORY OF PRESENT ILLNESS:Spencer Burke is a 57year old male with a history of memory loss, ADD, polyneuropathy. He returns today for follow-up. Marland KitchenHe is currently taking adderall  20 mg 2 times daily. Patient states that he had neuropsychological testing over 15 years ago that suggested that he might have learning disorder. Therefore he was started on adderall. He feels that the adderall has helped.  He does have a mother and uncle that has alzheimer's diease. He feels that his memory loss has remained the same. 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 He does write articles but notices he has more difficulty writing his thoughts vs. Speaking them. He states that this is ongoing but denies it getting worse. He also is diabetic and has insulin pump. He returns for reevaluation.    REVIEW OF SYSTEMS: Full 14 system review of systems performed and notable only for those listed, all others are neg:  Constitutional: neg  Cardiovascular: neg Ear/Nose/Throat: neg  Skin: neg Eyes: neg Respiratory: neg Gastroitestinal: neg  Hematology/Lymphatic: neg  Endocrine: neg Musculoskeletal:neg Allergy/Immunology: neg Neurological: neg Psychiatric: neg Sleep : neg   ALLERGIES: Allergies  Allergen Reactions  . Tetracyclines & Related     HOME MEDICATIONS: Outpatient Medications Prior to Visit  Medication Sig Dispense Refill  . amphetamine-dextroamphetamine (ADDERALL) 20 MG tablet Take 1 tablet (20 mg total) by mouth 2 (two) times daily. 60 tablet 0  . aspirin 81 MG tablet Take 81 mg by mouth daily.    [name] glucose blood test strip CHECK BLOOD SUGAR 10-12 TIMES DAILY    . Insulin Human (INSULIN PUMP) SOLN by Intravenous (Continuous Infusion) route. 5 units, Basal rate continous    . insulin lispro  (HUMALOG) 100 UNIT/ML injection Inject into the skin. Use in insulin pump as directed.  Up to 65 units every day.    . lisinopril (PRINIVIL,ZESTRIL) 10 MG tablet TAKE ONE TABLET BY MOUTH DAILY (pt stated he is tritrating back up ot , taking  now)    . loratadine (CLARITIN) 10 MG tablet Take 5 mg by mouth as needed.     No facility-administered medications prior to visit.     PAST MEDICAL HISTORY: Past Medical History:  Diagnosis Date  . ADD (attention deficit disorder)   . Diabetes (HCC)     PAST SURGICAL HISTORY: Past Surgical History:  Procedure Laterality Date  . FOOT FRACTURE SURGERY Left 2015   foot and ankle (foot this yr)  . MOUTH SURGERY    . NASAL SINUS SURGERY  06/2009  . REFRACTIVE SURGERY  04/2009  . SHOULDER SURGERY Left 09/2009    FAMILY HISTORY: Family History  Problem Relation Age of Onset  . Transient ischemic attack Mother   . Transient ischemic attack Father     SOCIAL HISTORY: Social History   Social History  . Marital status: Single    Spouse name: N/A  . Number of children: 2  . Years of education: 12+   Occupational History  . Not on file.   Social History Main Topics  . Smoking status: Never Smoker  . Smokeless tobacco: Never Used  . Alcohol use No  . Drug use: No  . Sexual activity: Not on file   Other Topics Concern  . 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    Patient has 2 adopted children.    Patient is right handed.   Patient drinks 2 cups of caffeine daily.      PHYSICAL EXAM  There were no vitals filed for this visit. There is no height or weight on file to calculate BMI. Generalized: Well developed, in no acute distress   Neurological examination  Mentation: Alert oriented to time, place, history taking. Follows all commands speech and language fluent.  Cranial nerve II-XII: Pupils were equal round reactive to light. Extraocular  movements were full, visual field were full on confrontational test. Facial sensation and strength were normal.Uvula tongue midline. Head turning and shoulder shrug were normal and symmetric. Motor: The motor testing reveals 5 over 5 strength of all 4 extremities. Good symmetric motor tone is noted throughout.  Sensory: Sensory testing is intact to soft touch on all 4 extremities. No evidence of extinction is noted.  Coordination: Cerebellar testing reveals good finger-nose-finger and heel-to-shin bilaterally.  Gait and station: Gait is normal. Tandem gait is normal. Romberg is negative. No drift is seen.  Reflexes: Deep tendon reflexes are symmetric and normal bilaterally.    DIAGNOSTIC DATA (LABS, IMAGING, TESTING)  ASSESSMENT AND PLAN 57 y.o. year old male  has a past medical history of Diabetes (HCC) and ADD (attention deficit disorder). here to follow-up.  Continue Adderall Rx Adderall 20 mg twice daily Rx was just picked up today Follow-up in 6 months Nilda Riggs, [facility], [facility], APRN  [facility] [address], [address] [place], [place] [address] [phone]

## 2018-04-25 ENCOUNTER — Ambulatory Visit: Payer: Managed Care, Other (non HMO) | Admitting: Nurse Practitioner

## 2018-04-25 ENCOUNTER — Telehealth: Payer: Self-pay | Admitting: *Deleted

## 2018-04-25 DIAGNOSIS — Z0289 Encounter for other administrative examinations: Secondary | ICD-10-CM

## 2018-04-25 NOTE — Telephone Encounter (Signed)
Patient called today and cancelled his follow up today re: conflicting work schedule.

## 2018-04-26 ENCOUNTER — Encounter: Payer: Self-pay | Admitting: Nurse Practitioner

## 2018-05-06 ENCOUNTER — Other Ambulatory Visit: Payer: Self-pay | Admitting: Nurse Practitioner

## 2018-05-06 MED ORDER — AMPHETAMINE-DEXTROAMPHETAMINE 20 MG PO TABS: 20.0000 mg | ORAL_TABLET | Freq: Two times a day (BID) | ORAL | 0 refills | Status: DC

## 2018-05-06 NOTE — Telephone Encounter (Signed)
Pt request refill for amphetamine-dextroamphetamine (ADDERALL) 20 MG tablet [facility]. Pt has an appt on 7/9.

## 2018-05-28 ENCOUNTER — Other Ambulatory Visit: Payer: Self-pay | Admitting: Nurse Practitioner

## 2018-05-28 NOTE — Telephone Encounter (Signed)
Pt requesting a refill for amphetamine-dextroamphetamine (ADDERALL) 20 MG tablet [facility]

## 2018-05-28 NOTE — Telephone Encounter (Signed)
Last fill 05-06-18 #60 Per Drug Registry [facility].  No showed 04-25-18 appt, but is scheduled 06-11-18 at 1115.

## 2018-05-29 NOTE — Telephone Encounter (Signed)
I spoke to pt and he will call back and about refill on adderall closer to 05-06-18.  He was appreciative of call.

## 2018-06-10 NOTE — Progress Notes (Signed)
[facility]  PATIENT: Spencer Burke DOB: Dec 22, 1960   REASON FOR VISIT: Follow-up for attention deficit disorder HISTORY FROM: Patient    HISTORY OF PRESENT ILLNESS:Spencer Burke is a 57year old male with a history of memory loss, ADD, polyneuropathy. He returns today for follow-up. [name].He is currently taking adderall  20 mg 2 times daily. Patient states that he had neuropsychological testing over 16 years ago that suggested that he might have learning disorder. Therefore he was started on adderall. He feels that the adderall has helped.  He does have a mother and uncle that has alzheimer's diease. He feels that his memory loss has remained the same. 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 . He has good performance on the job. He is also is diabetic and has insulin pump. He returns for reevaluation.    REVIEW OF SYSTEMS: Full 14 system review of systems performed and notable only for those listed, all others are neg:  Constitutional: neg  Cardiovascular: neg Ear/Nose/Throat: neg  Skin: neg Eyes: neg Respiratory: neg Gastroitestinal: neg  Hematology/Lymphatic: neg  Endocrine: neg Musculoskeletal:neg Allergy/Immunology: neg Neurological: neg Psychiatric: neg Sleep : neg   ALLERGIES: Allergies  Allergen Reactions  . Tetracyclines & Related     HOME MEDICATIONS: Outpatient Medications Prior to Visit  Medication Sig Dispense Refill  . amphetamine-dextroamphetamine (ADDERALL) 20 MG tablet Take 1 tablet (20 mg total) by mouth 2 (two) times daily. 60 tablet 0  . aspirin 81 MG tablet Take 81 mg by mouth daily.    [name]. glucose blood test strip CHECK BLOOD SUGAR 10-12 TIMES DAILY    . Insulin Human (INSULIN PUMP) SOLN by Intravenous (Continuous Infusion) route. 5 units, Basal rate continous    . insulin lispro (HUMALOG) 100 UNIT/ML injection Inject into the skin. Use in insulin pump as directed.  Up to 65 units every day.    . lisinopril (PRINIVIL,ZESTRIL) 10  MG tablet TAKE ONE TABLET BY MOUTH DAILY (pt stated he is tritrating back up ot 10mg , taking 5mg  now)    . loratadine (CLARITIN) 10 MG tablet Take 5 mg by mouth as needed.     No facility-administered medications prior to visit.     PAST MEDICAL HISTORY: Past Medical History:  Diagnosis Date  . ADD (attention deficit disorder)   . Diabetes (HCC)     PAST SURGICAL HISTORY: Past Surgical History:  Procedure Laterality Date  . FOOT FRACTURE SURGERY Left 2015   foot and ankle (foot this yr)  . MOUTH SURGERY    . NASAL SINUS SURGERY  06/2009  . REFRACTIVE SURGERY  04/2009  . SHOULDER SURGERY Left 09/2009    FAMILY HISTORY: Family History  Problem Relation Age of Onset  . Transient ischemic attack Mother   . Transient ischemic attack Father     SOCIAL HISTORY: Social History   Social History  . Marital status: Single    Spouse name: N/A  . Number of children: 2  . Years of education: 12+   Occupational History  . Not on file.   Social History Main Topics  . Smoking status: Never Smoker  . Smokeless tobacco: Never Used  . Alcohol use No  . Drug use: No  . Sexual activity: Not on file   Other Topics Concern  . 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    Patient has 2 adopted  children.    Patient is right handed.   Patient drinks 2 cups of caffeine daily.      PHYSICAL EXAM  Vitals:   06/11/18 1052  BP: 110/66  Pulse: 76  Weight: 169 lb (76.7 kg)  Height: 5' 9.5" (1.765 m)   Body mass index is 24.6 kg/m. Generalized: Well developed, in no acute distress   Neurological examination  Mentation: Alert oriented to time, place, history taking. Follows all commands speech and language fluent.  Cranial nerve II-XII: Pupils were equal round reactive to light. Extraocular movements were full, visual field were full on confrontational test. Facial sensation and strength were  normal.Uvula tongue midline. Head turning and shoulder shrug were normal and symmetric. Motor: The motor testing reveals 5 over 5 strength of all 4 extremities. Good symmetric motor tone is noted throughout.  Sensory: Sensory testing is intact to soft touch on all 4 extremities. No evidence of extinction is noted.  Coordination: Cerebellar testing reveals good finger-nose-finger and heel-to-shin bilaterally.  Gait and station: Gait is normal. Tandem gait is normal. Romberg is negative. No drift is seen.  Reflexes: Deep tendon reflexes are symmetric and normal bilaterally.    DIAGNOSTIC DATA (LABS, IMAGING, TESTING)  ASSESSMENT AND PLAN 57 y.o. year old male  has a past medical history of Diabetes (HCC) and ADD (attention deficit disorder). here to follow-up.  Continue Adderall Rx Adderall 20 mg twice daily will refill, ( reviewed narcotic registry) Follow-up yearly Nilda Riggs, [facility], [facility], APRN  [facility] [address], [address] [place], [place] [address] [phone]

## 2018-06-11 ENCOUNTER — Encounter: Payer: Self-pay | Admitting: Nurse Practitioner

## 2018-06-11 ENCOUNTER — Ambulatory Visit (INDEPENDENT_AMBULATORY_CARE_PROVIDER_SITE_OTHER): Payer: Managed Care, Other (non HMO) | Admitting: Nurse Practitioner

## 2018-06-11 VITALS — BP 110/66 | HR 76 | Ht 69.5 in | Wt 169.0 lb

## 2018-06-11 DIAGNOSIS — F909 Attention-deficit hyperactivity disorder, unspecified type: Secondary | ICD-10-CM

## 2018-06-11 MED ORDER — AMPHETAMINE-DEXTROAMPHETAMINE 20 MG PO TABS: 20.0000 mg | ORAL_TABLET | Freq: Two times a day (BID) | ORAL | 0 refills | Status: DC

## 2018-06-11 NOTE — Patient Instructions (Signed)
Continue Adderall Rx Adderall 20 mg twice daily will refill Follow-up yearly

## 2018-06-11 NOTE — Progress Notes (Signed)
Called patient re: printed Adderall Rx. He is still in town and will pick up later today. Adderall refill Rx at front desk for patient to pick up.

## 2018-09-09 ENCOUNTER — Other Ambulatory Visit: Payer: Self-pay | Admitting: Nurse Practitioner

## 2018-09-09 MED ORDER — AMPHETAMINE-DEXTROAMPHETAMINE 20 MG PO TABS: 20.0000 mg | ORAL_TABLET | Freq: Two times a day (BID) | ORAL | 0 refills | Status: DC

## 2018-09-09 NOTE — Telephone Encounter (Signed)
Pt has called for a refill on his amphetamine-dextroamphetamine (ADDERALL) 20 MG tablet Please send to  [facility] [place], [place] - [address]. [phone] (Phone) [phone] (Fax)

## 2018-09-09 NOTE — Telephone Encounter (Signed)
Drug Registry checked Last fill adderall 20mg  po bid # 60 07-17-18.

## 2018-09-09 NOTE — Addendum Note (Signed)
Addended by: Hermenia Fiscal S on: 09/09/2018 01:13 PM   Modules accepted: Orders

## 2018-11-28 ENCOUNTER — Other Ambulatory Visit: Payer: Self-pay | Admitting: Nurse Practitioner

## 2018-11-28 MED ORDER — AMPHETAMINE-DEXTROAMPHETAMINE 20 MG PO TABS: 20.0000 mg | ORAL_TABLET | Freq: Two times a day (BID) | ORAL | 0 refills | Status: DC

## 2018-11-28 NOTE — Telephone Encounter (Signed)
Drug registry checked.  Last fill 10/02/2018 #60

## 2018-11-28 NOTE — Telephone Encounter (Signed)
Pt requesting refills for amphetamine-dextroamphetamine (ADDERALL) [facility]  Stating he has new insurance as well.  [employer] Name: Deanna ArtisWilliam H Sobh  Member I.D. [id number] Rx Vin # [id number]  Group # [id number]

## 2019-01-28 ENCOUNTER — Other Ambulatory Visit: Payer: Self-pay | Admitting: Nurse Practitioner

## 2019-01-28 MED ORDER — AMPHETAMINE-DEXTROAMPHETAMINE 20 MG PO TABS: 20.0000 mg | ORAL_TABLET | Freq: Two times a day (BID) | ORAL | 0 refills | Status: DC

## 2019-01-28 NOTE — Telephone Encounter (Signed)
Pt has called for a refill on his amphetamine-dextroamphetamine (ADDERALL) 20 MG tablet  [facility] [address] [place],  [phone]

## 2019-01-28 NOTE — Addendum Note (Signed)
Addended by: Guy Begin on: 01/28/2019 03:50 PM   Modules accepted: Orders

## 2019-01-28 NOTE — Telephone Encounter (Signed)
Drug registry checked. Last fill 11-28-2018 #60.  Adderall

## 2019-02-03 ENCOUNTER — Encounter: Payer: Self-pay | Admitting: Nurse Practitioner

## 2019-02-20 ENCOUNTER — Other Ambulatory Visit: Payer: Self-pay | Admitting: Nurse Practitioner

## 2019-02-20 NOTE — Addendum Note (Signed)
Addended by: Maryland Pink on: 02/20/2019 03:45 PM   Modules accepted: Orders

## 2019-02-20 NOTE — Telephone Encounter (Signed)
The Adderall prescription will not be due until 25 February 2019.

## 2019-02-20 NOTE — Telephone Encounter (Signed)
Pt is calling for a refill on his amphetamine-dextroamphetamine (ADDERALL) 20 MG tablet   [phone]

## 2019-02-20 NOTE — Telephone Encounter (Signed)
Called patient and advised his one year FU needs to be scheduled. I scheduled him to see Karren Burly, NP for Dr Anne Hahn. I advised will send Adderall refill request to Dr Anne Hahn. Patient verbalized understanding, appreciation.

## 2019-02-26 ENCOUNTER — Telehealth: Payer: Self-pay | Admitting: Nurse Practitioner

## 2019-02-26 MED ORDER — AMPHETAMINE-DEXTROAMPHETAMINE 20 MG PO TABS: 20.0000 mg | ORAL_TABLET | Freq: Two times a day (BID) | ORAL | 0 refills | Status: DC

## 2019-02-26 NOTE — Telephone Encounter (Signed)
[place] Database Verified LR: 01-28-2019 Qty: 60 Pending appointment: 06-16-2019 Margie Ege, NP)

## 2019-02-26 NOTE — Telephone Encounter (Signed)
Pt is needing a refill on his amphetamine-dextroamphetamine (ADDERALL) 20 MG tablet and [facility] [place]   Pt states that he called like a week ago to have it filled and he is still waiting. Please advise.

## 2019-03-06 MED ORDER — AMPHETAMINE-DEXTROAMPHETAMINE 20 MG PO TABS: 20.0000 mg | ORAL_TABLET | Freq: Two times a day (BID) | ORAL | 0 refills | Status: DC

## 2019-03-06 NOTE — Addendum Note (Signed)
Addended by: York Spaniel on: 03/06/2019 01:16 PM   Modules accepted: Orders

## 2019-03-06 NOTE — Telephone Encounter (Signed)
Pt has called to report that because of his medical condition he needs his amphetamine-dextroamphetamine (ADDERALL) 20 MG tablet [facility]. The location [id number] [place], [place] - [address]does not have a drive Thur.  Pt is asking the Rx be sent to the [facility] @ [phone]

## 2019-03-06 NOTE — Telephone Encounter (Signed)
The prescription was sent into the other pharmacy.

## 2019-05-19 ENCOUNTER — Telehealth: Payer: Self-pay | Admitting: Neurology

## 2019-05-19 MED ORDER — AMPHETAMINE-DEXTROAMPHETAMINE 20 MG PO TABS: 20.0000 mg | ORAL_TABLET | Freq: Two times a day (BID) | ORAL | 0 refills | Status: DC

## 2019-05-19 NOTE — Telephone Encounter (Signed)
[place] drug registry verified. Last refill was 03/07/19 # 60 for a 30 day supply given by Dr. Jannifer Franklin.

## 2019-05-19 NOTE — Telephone Encounter (Signed)
The Adderall prescription will be refilled. 

## 2019-05-19 NOTE — Telephone Encounter (Signed)
Pt is calling in for a refill of amphetamine-dextroamphetamine (ADDERALL) 20 MG tablet [id number] [place]

## 2019-06-11 ENCOUNTER — Encounter (INDEPENDENT_AMBULATORY_CARE_PROVIDER_SITE_OTHER): Payer: Self-pay

## 2019-06-11 ENCOUNTER — Encounter: Payer: Self-pay | Admitting: Neurology

## 2019-06-11 ENCOUNTER — Telehealth: Payer: Self-pay | Admitting: Neurology

## 2019-06-11 NOTE — Telephone Encounter (Signed)
Noted  

## 2019-06-11 NOTE — Telephone Encounter (Signed)
I called patient regarding his 7/13 appt with Sarah. I offered patient a virtual visit for this appt due to Judson Roch only seeing virtual visits on Monday afternoon. Patient agreed to a virtual visit and I helped assist him create a mychart account. Patient verbalized understanding of the mychart virtual visit and I knows to log on to mychart to start his appt. I advised patient to call back with any questions.   Pt understands that although there may be some limitations with this type of visit, we will take all precautions to reduce any security or privacy concerns.  Pt understands that this will be treated like an in office visit and we will file with pt's insurance, and there may be a patient responsible charge related to this service.

## 2019-06-15 NOTE — Progress Notes (Signed)
Virtual Visit via Video Note  I connected with Deanna ArtisWilliam H Cichowski on 06/16/19 at  3:15 PM EDT by a video enabled telemedicine application and verified that I am speaking with the correct person using two identifiers.  Location: Patient: At his home Provider: In the office    I discussed the limitations of evaluation and management by telemedicine and the availability of in person appointments. The patient expressed understanding and agreed to proceed.  History of Present Illness: 06/16/2019 SS: Mr. Sedalia MutaCox is a 58 year old male with history of memory loss, ADD, and polyneuropathy.  He remains on Adderall 20 mg twice daily.  A neuropsychological evaluation done over 15 years ago suggested he might have a learning disorder, has been on Adderall since that time.  He has a family history of Alzheimer's disease, affecting his mother and uncle. He does have some difficulty with memory. He doesn't spell well, can't remember his credit card #. He has difficulty with his short term memory.  He is diabetic with an insulin pump. He has learned to cope with his learning disability. He switched jobs and now he works as a Production designer, theatre/television/filmmanager at [employer]. He works long hours and more stress. In the last year his overall health has been good. He exercises regularly. We had to convert to telephone visit, due to being unable to assess My Chart, doxy.me. The medication helps him focus more. He does follow with a primary care doctor.   06/11/2018 CM: Mr. Sedalia MutaCox is a [age]year old male with a history of memory loss, ADD, polyneuropathy. He returns today for follow-up. [name].He is currently taking adderall  20 mg 2 times daily. Patient states that he had neuropsychological testing over 16 years ago that suggested that he might have learning disorder. Therefore he was started on adderall. He feels that the adderall has helped.  He does have a mother and uncle that has alzheimer's diease. He feels that his memory loss has remained the same. 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 . He has good performance on the job. He is also is diabetic and has insulin pump. He returns for reevaluation.    Observations/Objective: Telephone call, is alert and oriented, answer questions appropriately, speech is clear and concise  Assessment and Plan: 1.  Attention deficit disorder 2.  Memory loss  He indicates he is doing well on Adderall, it helps him to focus.  I will send in a refill of his medication Adderall 20 mg 1 tablet twice daily. I checked [place] drug registry, last fill was 05/19/2019 by Dr. Anne HahnWillis, 60 tablets.  He had a neuropsychological evaluation over 15 years ago.  He reports continued difficulty with memory loss, especially short-term memory.  He was found to have a learning disability.  At next visit, we will recheck a MMSE, depending on the score we could consider a repeat neuropsychological evaluation.  He will follow-up in 6 months or sooner if needed.  Follow Up Instructions: 6 months 12/24/2019 12:45 He will return in 6 months, at that time we can do MMSE    I discussed the assessment and treatment plan with the patient. The patient was provided an opportunity to ask questions and all were answered. The patient agreed with the plan and demonstrated an understanding of the instructions.   The patient was advised to call back or seek an in-person evaluation if the symptoms worsen or if the condition fails to improve as anticipated.  I provided 25 minutes of  non-face-to-face time during this encounter.  Evangeline Dakin, DNP  [facility] [address] [place], [place] [address] [phone]

## 2019-06-16 ENCOUNTER — Other Ambulatory Visit: Payer: Self-pay

## 2019-06-16 ENCOUNTER — Telehealth (INDEPENDENT_AMBULATORY_CARE_PROVIDER_SITE_OTHER): Payer: 59 | Admitting: Neurology

## 2019-06-16 ENCOUNTER — Encounter: Payer: Self-pay | Admitting: Neurology

## 2019-06-16 DIAGNOSIS — F909 Attention-deficit hyperactivity disorder, unspecified type: Secondary | ICD-10-CM | POA: Diagnosis not present

## 2019-06-16 MED ORDER — AMPHETAMINE-DEXTROAMPHETAMINE 20 MG PO TABS: 20.0000 mg | ORAL_TABLET | Freq: Two times a day (BID) | ORAL | 0 refills | Status: DC

## 2019-06-16 NOTE — Progress Notes (Signed)
I have read the note, and I agree with the clinical assessment and plan.  Charles K Willis   

## 2019-06-17 ENCOUNTER — Ambulatory Visit: Payer: Managed Care, Other (non HMO) | Admitting: Nurse Practitioner

## 2019-08-06 ENCOUNTER — Telehealth: Payer: Self-pay | Admitting: Neurology

## 2019-08-06 NOTE — Telephone Encounter (Signed)
Pt is asking for a call from RN to discuss 2 new concerning symptoms he is experiencing.  Pt asked to just wait and discuss in greater detail when RN calls him back

## 2019-08-06 NOTE — Telephone Encounter (Signed)
I called pt and he has referral from his pcp, for muscle weakness.  Last Friday and seen Monday at [facility] practice and records in care everywhere.  New problem for him.  Stated that had leg weakness, falls , balance off.  Stated that pcp thought autonomic neuropathy.?  Has appt with Dr. Jannifer Franklin 08-14-19 for consult.  He will seek care at ED if things get worse or has acute sx.  He may call to see if cancellations daily with Dr. Jannifer Franklin to get is sooner.

## 2019-08-08 ENCOUNTER — Other Ambulatory Visit: Payer: Self-pay | Admitting: Neurology

## 2019-08-08 NOTE — Telephone Encounter (Signed)
Pt is needing a refill on his amphetamine-dextroamphetamine (ADDERALL) 20 MG tablet [facility]

## 2019-08-12 MED ORDER — AMPHETAMINE-DEXTROAMPHETAMINE 20 MG PO TABS: 20.0000 mg | ORAL_TABLET | Freq: Two times a day (BID) | ORAL | 0 refills | Status: DC

## 2019-08-12 NOTE — Telephone Encounter (Signed)
Drug registry checked last fill 06-19-19 #60. Adderall 20mg .

## 2019-08-14 ENCOUNTER — Ambulatory Visit: Payer: 59 | Admitting: Neurology

## 2019-08-14 ENCOUNTER — Encounter: Payer: Self-pay | Admitting: Neurology

## 2019-08-14 ENCOUNTER — Other Ambulatory Visit: Payer: Self-pay

## 2019-08-14 ENCOUNTER — Telehealth: Payer: Self-pay | Admitting: Neurology

## 2019-08-14 ENCOUNTER — Encounter

## 2019-08-14 DIAGNOSIS — G3281 Cerebellar ataxia in diseases classified elsewhere: Secondary | ICD-10-CM | POA: Diagnosis not present

## 2019-08-14 NOTE — Telephone Encounter (Signed)
[employer] order sent to [facility]. They will obtain the auth and reach out to the patient to schedule.  

## 2019-08-14 NOTE — Progress Notes (Signed)
Reason for visit: Gait disturbance  Referring physician: Dr. Mardene SayerHollar  Spencer Burke is a 58 y.o. male  History of present illness:  Mr. Sedalia MutaCox is a 58 year old right-handed white male with a history of attention deficit disorder on Adderall.  The patient comes in today with a completely new problem.  He claims that about 6 weeks ago he began having relatively sudden onset of problems with walking and gait instability.  Around the time of onset he did not note any headache or vertigo or double vision.  He had no slurred speech or numbness or weakness of the extremities.  At times, he may feel rubbery with his legs, he may stagger quite a bit, he has had multiple falls.  He has not fallen in the last 2 weeks, he believes that over time his ability to walk has improved.  He may go from one side to the next, he does not always veer to one side.  He has not had any blackout episodes, he denies any visual dimming.  He reports no neck pain or low back pain or pain down the arms or legs.  He denies issues controlling the bowels or the bladder.  He has not had any shortness of breath or chest pain or palpitations of the heart.  He does have a history of type 1 diabetes, he was told that he had some orthostatic hypotension when initially evaluated.  He is sent to this office for an evaluation.  His walking is still not back to normal.  Past Medical History:  Diagnosis Date  . ADD (attention deficit disorder)   . Diabetes [facility](HCC)     Past Surgical History:  Procedure Laterality Date  . FOOT FRACTURE SURGERY Left 2015   foot and ankle (foot this yr)  . MOUTH SURGERY    . NASAL SINUS SURGERY  06/2009  . REFRACTIVE SURGERY  04/2009  . SHOULDER SURGERY Left 09/2009    Family History  Problem Relation Age of Onset  . Transient ischemic attack Mother   . Transient ischemic attack Father     Social history:  reports that he has never smoked. He has never used smokeless tobacco. He reports that he does  not drink alcohol or use drugs.  Medications:  Prior to Admission medications   Medication Sig Start Date End Date Taking? Authorizing Provider  amphetamine-dextroamphetamine (ADDERALL) 20 MG tablet Take 1 tablet (20 mg total) by mouth 2 (two) times daily. 08/12/19  Yes Glean SalvoSlack, Sarah J, NP  glucose blood test strip CHECK BLOOD SUGAR 10-12 TIMES DAILY 04/05/15  Yes [provider]  Insulin Human (INSULIN PUMP) SOLN by Intravenous (Continuous Infusion) route. 5 units, Basal rate continous   Yes [provider]  insulin lispro (HUMALOG) 100 UNIT/ML injection Inject into the skin. Use in insulin pump as directed.  Up to 65 units every day.   Yes [provider]  lisinopril (PRINIVIL,ZESTRIL) 10 MG tablet TAKE ONE TABLET BY MOUTH DAILY (pt stated he is tritrating back up ot 10mg , taking 5mg  now) 01/04/15  Yes [provider]  loratadine (CLARITIN) 10 MG tablet Take 5 mg by mouth as needed.   Yes [provider]      Allergies  Allergen Reactions  . Tetracyclines & Related     ROS:  Out of a complete 14 system review of symptoms, the patient complains only of the following symptoms, and all other reviewed systems are negative.  Walking difficulty  Temperature 97.8 F (  36.6 C), temperature source Temporal, height 5\' 9"  (1.753 m), weight 168 lb (76.2 kg).   Blood pressure, right arm, sitting is 146/90.  Blood pressure, right arm, standing is 150/82.  Physical Exam  General: The patient is alert and cooperative at the time of the examination.  Eyes: Pupils are equal, round, and reactive to light. Discs are flat bilaterally.  Neck: The neck is supple, no carotid bruits are noted.  Respiratory: The respiratory examination is clear.  Cardiovascular: The cardiovascular examination reveals a regular rate and rhythm, no obvious murmurs or rubs are noted.  Skin: Extremities are without significant edema.  Neurologic Exam  Mental status: The patient  is alert and oriented x 3 at the time of the examination. The patient has apparent normal recent and remote memory, with an apparently normal attention span and concentration ability.  Cranial nerves: Facial symmetry is present. There is good sensation of the face to pinprick and soft touch bilaterally. The strength of the facial muscles and the muscles to head turning and shoulder shrug are normal bilaterally. Speech is well enunciated, no aphasia or dysarthria is noted. Extraocular movements are full. Visual fields are full. The tongue is midline, and the patient has symmetric elevation of the soft palate. No obvious hearing deficits are noted.  Motor: The motor testing reveals 5 over 5 strength of all 4 extremities. Good symmetric motor tone is noted throughout.  Sensory: Sensory testing is intact to pinprick, soft touch, vibration sensation, and position sense on all 4 extremities. No evidence of extinction is noted.  Coordination: Cerebellar testing reveals good finger-nose-finger and heel-to-shin bilaterally.  Gait and station: Gait is slightly wide-based. Tandem gait is unsteady. Romberg is negative. No drift is seen.  Reflexes: Deep tendon reflexes are symmetric and normal bilaterally, with exception of depression of ankle jerks bilaterally. Toes are downgoing bilaterally.   Assessment/Plan:  1.  New onset gait instability  The patient does have risk factors for stroke, he does have a history of diabetes and hypertension.  He notes a relatively rapid onset of gait instability.  Today, he does not appear to be orthostatic, there is no evidence of weakness of the muscles on clinical examination, and no evidence of a significant neuropathy that would lead to gait instability.  The patient will be set up for blood work today, we will set him up for physical therapy for gait training, MRI of the brain will be done to exclude a cerebrovascular event.  We will follow-up in 2 months.  Jill Alexanders MD 08/14/2019 1:37 PM  [facility] [address] [place], [place] [address]  Phone [phone] Fax [phone]

## 2019-08-19 LAB — RPR: RPR Ser Ql: NONREACTIVE

## 2019-08-19 LAB — SEDIMENTATION RATE: Sed Rate: 5 mm/hr (ref 0–30)

## 2019-08-19 LAB — B. BURGDORFI ANTIBODIES: Lyme IgG/IgM Ab: <0.91 {ISR} (ref 0.00–0.90)

## 2019-08-19 LAB — VITAMIN B12: Vitamin B-12: 280 pg/mL (ref 232–1245)

## 2019-08-19 LAB — ANA W/REFLEX: Anti Nuclear Antibody (ANA): NEGATIVE

## 2019-08-19 LAB — COPPER, SERUM: Copper: 104 ug/dL (ref 72–166)

## 2019-08-31 ENCOUNTER — Other Ambulatory Visit: Payer: Self-pay

## 2019-08-31 ENCOUNTER — Ambulatory Visit
Admission: RE | Admit: 2019-08-31 | Discharge: 2019-08-31 | Disposition: A | Discharge status: Home / Self Care | Payer: Self-pay | Source: Ambulatory Visit | Attending: Neurology | Admitting: Neurology

## 2019-08-31 DIAGNOSIS — G3281 Cerebellar ataxia in diseases classified elsewhere: Secondary | ICD-10-CM | POA: Diagnosis not present

## 2019-09-01 ENCOUNTER — Telehealth: Payer: Self-pay | Admitting: Neurology

## 2019-09-01 DIAGNOSIS — I6322 Cerebral infarction due to unspecified occlusion or stenosis of basilar arteries: Secondary | ICD-10-CM

## 2019-09-01 DIAGNOSIS — Z5181 Encounter for therapeutic drug level monitoring: Secondary | ICD-10-CM

## 2019-09-01 NOTE — Telephone Encounter (Signed)
Spencer Burke: [id number] (exp. 08-28-19 to 02-24-20) patient had MRI at [facility] on 08/31/19

## 2019-09-01 NOTE — Telephone Encounter (Signed)
I tried to call the patient, unable to leave a message, MRI of the brain does show a small lacunar infarct in the upper pons, possibly involving the midbrain slightly.  This may be a source of gait instability, but the stroke appears to be older than what the patient indicates was onset of symptoms.  At any rate, the patient will need to go on aspirin therapy, we will consider CT angiogram of the head and neck and 2D echocardiogram.  I will try to call back later.    MRI brain 08/31/19:  IMPRESSION: This MRI of the brain without contrast shows the following: 1.   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  In  the hemispheres there are a couple punctate T2/flair hyperintense foci  consistent with age-appropriate very minimal chronic microvascular  ischemic changes. 2.   Chronic maxillary sinusitis.   3.   Mild left mastoid effusion possibly due to eustachian tube  dysfunction.   4.   There were no acute findings.

## 2019-09-02 ENCOUNTER — Telehealth: Payer: Self-pay | Admitting: Neurology

## 2019-09-02 ENCOUNTER — Other Ambulatory Visit: Payer: Self-pay

## 2019-09-02 ENCOUNTER — Other Ambulatory Visit (INDEPENDENT_AMBULATORY_CARE_PROVIDER_SITE_OTHER): Payer: Self-pay

## 2019-09-02 DIAGNOSIS — Z5181 Encounter for therapeutic drug level monitoring: Secondary | ICD-10-CM

## 2019-09-02 DIAGNOSIS — Z0289 Encounter for other administrative examinations: Secondary | ICD-10-CM

## 2019-09-02 NOTE — Telephone Encounter (Signed)
I tried to call patient again, unable to leave a message, I will call back later.

## 2019-09-02 NOTE — Telephone Encounter (Signed)
I called again, was able to reach the patient, he is to go on low-dose aspirin, will get a 2D echocardiogram and a CT angiogram of the head and neck set up, he will come in for blood work to check his kidney function.

## 2019-09-02 NOTE — Telephone Encounter (Signed)
[employer] order sent to [facility]. They will obtain the auth and reach out to the patient to schedule.  

## 2019-09-03 ENCOUNTER — Telehealth: Payer: Self-pay | Admitting: Neurology

## 2019-09-03 DIAGNOSIS — I6322 Cerebral infarction due to unspecified occlusion or stenosis of basilar arteries: Secondary | ICD-10-CM

## 2019-09-03 LAB — COMPREHENSIVE METABOLIC PANEL
ALT: 18 IU/L (ref 0–44)
AST: 23 IU/L (ref 0–40)
Albumin/Globulin Ratio: 1.7 (ref 1.2–2.2)
Albumin: 4 g/dL (ref 3.8–4.9)
Alkaline Phosphatase: 106 IU/L (ref 39–117)
BUN/Creatinine Ratio: 17 (ref 9–20)
BUN: 28 mg/dL — ABNORMAL HIGH (ref 6–24)
Bilirubin Total: 0.3 mg/dL (ref 0.0–1.2)
CO2: 23 mmol/L (ref 20–29)
Calcium: 9.4 mg/dL (ref 8.7–10.2)
Chloride: 100 mmol/L (ref 96–106)
Creatinine, Ser: 1.64 mg/dL — ABNORMAL HIGH (ref 0.76–1.27)
GFR calc Af Amer: 53 mL/min/{1.73_m2} — ABNORMAL LOW (ref >59)
GFR calc non Af Amer: 46 mL/min/{1.73_m2} — ABNORMAL LOW (ref >59)
Globulin, Total: 2.3 g/dL (ref 1.5–4.5)
Glucose: 349 mg/dL — ABNORMAL HIGH (ref 65–99)
Potassium: 5.1 mmol/L (ref 3.5–5.2)
Sodium: 135 mmol/L (ref 134–144)
Total Protein: 6.3 g/dL (ref 6.0–8.5)

## 2019-09-03 NOTE — Telephone Encounter (Signed)
[employer] order sent to [facility]. They will obtain the auth and reach out to the patient to schedule.  

## 2019-09-03 NOTE — Telephone Encounter (Signed)
The patient appears to have moderate renal impairment by blood work, estimated GFR is 46.  For this reason, I will not do CT angiogram of head and neck, these orders were canceled.  I will do MRA of the head, carotid Doppler study.

## 2019-09-04 ENCOUNTER — Other Ambulatory Visit: Payer: Self-pay

## 2019-09-04 ENCOUNTER — Ambulatory Visit (HOSPITAL_COMMUNITY): Payer: 59 | Attending: Neurology

## 2019-09-04 ENCOUNTER — Telehealth: Payer: Self-pay | Admitting: Neurology

## 2019-09-04 ENCOUNTER — Ambulatory Visit: Payer: 59 | Admitting: Neurology

## 2019-09-04 DIAGNOSIS — I6350 Cerebral infarction due to unspecified occlusion or stenosis of unspecified cerebral artery: Secondary | ICD-10-CM

## 2019-09-04 DIAGNOSIS — I6322 Cerebral infarction due to unspecified occlusion or stenosis of basilar arteries: Secondary | ICD-10-CM | POA: Insufficient documentation

## 2019-09-04 HISTORY — PX: TRANSTHORACIC ECHOCARDIOGRAM: SHX275

## 2019-09-04 HISTORY — DX: Cerebral infarction due to unspecified occlusion or stenosis of unspecified cerebral artery: I63.50

## 2019-09-04 NOTE — Telephone Encounter (Signed)
I called the patient.  The 2D echocardiogram was unremarkable.  I will call him when I get the results of the MRA of the head and carotid Doppler study.   2D echo 09/04/19:  IMPRESSIONS    1. Left ventricular ejection fraction, by visual estimation, is 60 to 65%. The left ventricle has normal function. Normal left ventricular size. There is no left ventricular hypertrophy.  2. Global right ventricle has normal systolic function.The right ventricular size is normal. No increase in right ventricular wall thickness.  3. Left atrial size was normal.  4. Right atrial size was normal.  5. The mitral valve is normal in structure. Trace mitral valve regurgitation. No evidence of mitral stenosis.  6. The tricuspid valve is normal in structure. Tricuspid valve regurgitation is trivial.  7. The aortic valve is normal in structure. Aortic valve regurgitation was not visualized by color flow Doppler. Mild aortic valve sclerosis without stenosis.  8. The pulmonic valve was normal in structure. Pulmonic valve regurgitation is not visualized by color flow Doppler.  9. Normal pulmonary artery systolic pressure. 10. The inferior vena cava is dilated in size with >50% respiratory variability, suggesting right atrial pressure of 8 mmHg.

## 2019-09-04 NOTE — Telephone Encounter (Signed)
Pt returning call please call back °

## 2019-09-04 NOTE — Telephone Encounter (Signed)
Noted  

## 2019-09-11 NOTE — Telephone Encounter (Signed)
Holli Humbles: [id number] (exp. 09/10/19 to 03/08/20) patient is scheduled at [facility] for 09/12/19.

## 2019-09-12 ENCOUNTER — Ambulatory Visit
Admission: RE | Admit: 2019-09-12 | Discharge: 2019-09-12 | Disposition: A | Discharge status: Home / Self Care | Payer: 59 | Source: Ambulatory Visit | Attending: Neurology | Admitting: Neurology

## 2019-09-12 ENCOUNTER — Other Ambulatory Visit: Payer: Self-pay

## 2019-09-12 DIAGNOSIS — I6320 Cerebral infarction due to unspecified occlusion or stenosis of unspecified precerebral arteries: Secondary | ICD-10-CM | POA: Diagnosis not present

## 2019-09-12 DIAGNOSIS — I6322 Cerebral infarction due to unspecified occlusion or stenosis of basilar arteries: Secondary | ICD-10-CM

## 2019-09-14 ENCOUNTER — Telehealth: Payer: Self-pay | Admitting: Neurology

## 2019-09-14 NOTE — Telephone Encounter (Signed)
I called the patient.  The MRI of the head is unremarkable, carotid Doppler study is pending.    MRA head 09/14/19:  IMPRESSION: Unremarkable MRI of the brain without contrast showing no significant narrowing of the large and medium size intracranial vessels.

## 2019-09-23 ENCOUNTER — Telehealth: Payer: Self-pay | Admitting: Neurology

## 2019-09-23 NOTE — Telephone Encounter (Signed)
I reached out to the pt. He reports over the last few days he has been struggling with balance, organizational skills and fatigue. Pt reports these sx are not new and is concerned the sx may be an indication of a new stroke.   Pt does not feel like his blood sugar is causing sx because blood sugars have been within a good range ( high end of 160- low end 50).  Pt reports he has been feeling very overwhelmed and concerned about these symptoms.  Pt reports he has two meetings today 1 at 530 pm and 1 a 8 pm and a physical therapy session at 4 pm.  Pt would like to further discuss with MD.  Best call back # is [phone].

## 2019-09-23 NOTE — Telephone Encounter (Signed)
I called the patient.  The patient over the last several days has had some undulating symptoms of feeling overwhelmed, not cognitively processing is well, possibly some worsening of balance but other times he feels fairly good.  He does have some variations in blood sugar.  It is not clear that he has new focal deficits that would indicate a stroke, he will monitor his symptoms, if he clearly believes that he is having new more persistent deficits, we will consider repeating MRI of the brain.

## 2019-09-23 NOTE — Telephone Encounter (Signed)
Pt called stating that he is having symptoms that he thinks that has to do with his stroke and would like to speak to the provider about them. Please advise.

## 2019-09-24 ENCOUNTER — Ambulatory Visit (HOSPITAL_COMMUNITY)
Admission: RE | Admit: 2019-09-24 | Discharge: 2019-09-24 | Disposition: A | Discharge status: Home / Self Care | Payer: 59 | Source: Ambulatory Visit | Attending: Neurology | Admitting: Neurology

## 2019-09-24 ENCOUNTER — Telehealth: Payer: Self-pay | Admitting: Neurology

## 2019-09-24 ENCOUNTER — Other Ambulatory Visit: Payer: Self-pay

## 2019-09-24 DIAGNOSIS — I6322 Cerebral infarction due to unspecified occlusion or stenosis of basilar arteries: Secondary | ICD-10-CM | POA: Diagnosis not present

## 2019-09-24 NOTE — Telephone Encounter (Signed)
  I called the patient.  The carotid Doppler study is unremarkable.  The patient is having problems with fatigue particular over the last couple weeks.  Fatigue can be associated with even small vessel strokes.  The patient will continue aspirin therapy, he is trying to stay on top of diabetic management and blood pressure management.  Carotid doppler 09/24/19:  Summary: Right Carotid: Velocities in the right ICA are consistent with a 1-39% stenosis.  Left Carotid: Velocities in the left ICA are consistent with a 1-39% stenosis.  Vertebrals:  Bilateral vertebral arteries demonstrate antegrade flow. Subclavians: Normal flow hemodynamics were seen in bilateral subclavian              arteries.

## 2019-09-24 NOTE — Progress Notes (Signed)
Carotid artery duplex completed. Refer to "CV Proc" under chart review to view preliminary results.  09/24/2019 3:56 PM Maudry Mayhew, MHA, RVT, RDCS, RDMS

## 2019-10-09 ENCOUNTER — Telehealth: Payer: Self-pay | Admitting: Neurology

## 2019-10-09 MED ORDER — AMPHETAMINE-DEXTROAMPHETAMINE 20 MG PO TABS: 20.0000 mg | ORAL_TABLET | Freq: Two times a day (BID) | ORAL | 0 refills | Status: DC

## 2019-10-09 NOTE — Telephone Encounter (Signed)
Pt is needing a refill on his amphetamine-dextroamphetamine (ADDERALL) [facility] MG [facility].

## 2019-10-09 NOTE — Telephone Encounter (Signed)
The Adderall will be refilled.  Last filled on 08/12/2019 60 tablets, no refills.

## 2019-10-20 ENCOUNTER — Telehealth: Payer: Self-pay | Admitting: Adult Health

## 2019-10-20 NOTE — Telephone Encounter (Signed)
I spoke to patient- he had a new referral sent. He is scheduled for 12/10. Wants to see Dr. Jannifer Franklin sooner. Does Dr. Jannifer Franklin has any work in slots before then?

## 2019-10-20 NOTE — Telephone Encounter (Signed)
Dr. Jannifer Franklin may have an opening come available on 10/28/2019... will hold to see if I have permission to use the slot.

## 2019-10-21 ENCOUNTER — Telehealth: Payer: Self-pay | Admitting: Neurology

## 2019-10-21 NOTE — Telephone Encounter (Signed)
Pt's brother stopped by today wanting to see if pt could get a sooner appointment. He states that the pt's primary doctor would like for him to be seen sooner due to worsening symptoms. He asks that we reach out to the pt directly at [phone]; please advise.

## 2019-10-21 NOTE — Telephone Encounter (Signed)
I contacted the pt and advised we are monitoring Dr. Jannifer Franklin schedule and will let him know as soon as possible if we have an opening. Pt was agreeable.

## 2019-10-22 NOTE — Telephone Encounter (Addendum)
Dr. Jannifer Franklin schedule has not openings at the moment. Will continue to monitor. Pt advised again today we will be in touch if a opening becomes available.

## 2019-11-13 ENCOUNTER — Ambulatory Visit (INDEPENDENT_AMBULATORY_CARE_PROVIDER_SITE_OTHER): Payer: 59 | Admitting: Neurology

## 2019-11-13 ENCOUNTER — Telehealth: Payer: Self-pay

## 2019-11-13 ENCOUNTER — Encounter: Payer: Self-pay | Admitting: Neurology

## 2019-11-13 ENCOUNTER — Other Ambulatory Visit: Payer: Self-pay

## 2019-11-13 VITALS — BP 138/80 | HR 77 | Temp 97.2°F | Ht 69.0 in | Wt 170.0 lb

## 2019-11-13 DIAGNOSIS — R269 Unspecified abnormalities of gait and mobility: Secondary | ICD-10-CM

## 2019-11-13 DIAGNOSIS — R413 Other amnesia: Secondary | ICD-10-CM | POA: Diagnosis not present

## 2019-11-13 NOTE — Progress Notes (Signed)
Reason for visit: Gait disturbance, memory disturbance  Spencer Burke is a 58 y.o. male  History of present illness:  Spencer Burke is a 58 year old right-handed white male with a history of attention deficit disorder, he currently is on Adderall.  The patient has been seen on 14 August 2019 with onset of gait instability that began 6 weeks prior to that evaluation.  MRI of the brain showed evidence of a small right upper pontine/midbrain stroke event, he was placed on aspirin, 2D echocardiogram and MRA of the head and carotid Doppler study were unremarkable.  The patient initially gained benefit with physical therapy with his walking.  However, over the last 6 or 8 weeks he has had episodes of gait instability that will come and go.  He was admitted to the hospital through [facility] on 10 October 2019.  Stroke work-up was negative.  The patient has continued to have good days and bad days.  He will have several hours when he cannot walk well and then he may go on a 10 mile hike the next day.  The patient reports that he has times where he feels weak all over, unable to hold his head up, he reports that his memory and concentration and word finding problems have worsened.  The patient reports no focal numbness or weakness of the face, arms, legs.  He denies headaches, or significant dizziness.  He reports no significant change in speech or swallowing.  He has not had any blackout episodes.  He currently has a cardiac monitor study in place.  Over the last 3 days his symptoms have worsened.  He has not been able to work recently over the last couple weeks because of the above problem.  He comes to the office today for an evaluation.  Past Medical History:  Diagnosis Date  . ADD (attention deficit disorder)   . Diabetes [facility])     Past Surgical History:  Procedure Laterality Date  . FOOT FRACTURE SURGERY Left 2015   foot and ankle (foot this yr)  . MOUTH SURGERY    . NASAL SINUS SURGERY  06/2009  .  REFRACTIVE SURGERY  04/2009  . SHOULDER SURGERY Left 09/2009    Family History  Problem Relation Age of Onset  . Transient ischemic attack Mother   . Transient ischemic attack Father     Social history:  reports that he has never smoked. He has never used smokeless tobacco. He reports that he does not drink alcohol or use drugs.  Medications:  Prior to Admission medications   Medication Sig Start Date End Date Taking? Authorizing Provider  amphetamine-dextroamphetamine (ADDERALL) 20 MG tablet Take 1 tablet (20 mg total) by mouth 2 (two) times daily. 10/09/19  Yes Glean Salvo, NP  aspirin 325 MG tablet Take 325 mg by mouth daily.   Yes [provider]  atorvastatin (LIPITOR) 80 MG tablet Take 80 mg by mouth daily.   Yes [provider]  glucose blood test strip CHECK BLOOD SUGAR 10-12 TIMES DAILY 04/05/15  Yes [provider]  insulin aspart (NOVOLOG) 100 UNIT/ML injection Inject into the skin 3 (three) times daily before meals. Use as directed for insulin pump.   Yes [provider]  Insulin Human (INSULIN PUMP) SOLN by Intravenous (Continuous Infusion) route. 5 units, Basal rate continous   Yes [provider]  lisinopril (PRINIVIL,ZESTRIL) 10 MG tablet TAKE ONE TABLET BY MOUTH DAILY (pt stated he is tritrating back up ot 10mg , taking  5mg  now) 01/04/15  Yes [provider]  loratadine (CLARITIN) 10 MG tablet Take 5 mg by mouth as needed.   Yes [provider]      Allergies  Allergen Reactions  . Tetracyclines & Related     ROS:  Out of a complete 14 system review of symptoms, the patient complains only of the following symptoms, and all other reviewed systems are negative.  Memory disturbance Weakness Walking difficulty  Blood pressure 138/80, pulse 77, temperature (!) 97.2 F (36.2 C), temperature source Temporal, height 5\' 9"  (1.753 m), weight 170 lb (77.1 kg), SpO2 98 %.   Blood pressure, right arm, sitting is  158/80.  Blood pressure, right arm, standing is 158/78.  Physical Exam  General: The patient is alert and cooperative at the time of the examination.  Eyes: Pupils are equal, round, and reactive to light. Discs are flat bilaterally.  Neck: The neck is supple, no carotid bruits are noted.  Respiratory: The respiratory examination is clear.  Cardiovascular: The cardiovascular examination reveals a regular rate and rhythm, no obvious murmurs or rubs are noted.  Skin: Extremities are without significant edema.  Neurologic Exam  Mental status: The patient is alert and oriented x 3 at the time of the examination. The patient has apparent normal recent and remote memory, with an apparently normal attention span and concentration ability.  Cranial nerves: Facial symmetry is present. There is good sensation of the face to pinprick and soft touch bilaterally. The strength of the facial muscles and the muscles to head turning and shoulder shrug are normal bilaterally. Speech is well enunciated, no aphasia or dysarthria is noted. Extraocular movements are full. Visual fields are full. The tongue is midline, and the patient has symmetric elevation of the soft palate. No obvious hearing deficits are noted.  Motor: The motor testing reveals 5 over 5 strength of all 4 extremities. Good symmetric motor tone is noted throughout.  Sensory: Sensory testing is intact to pinprick, soft touch and vibration sensation on all 4 extremities.  Position sensation is normal arms, may be slightly decreased in the feet.. No evidence of extinction is noted.  Coordination: Cerebellar testing reveals good finger-nose-finger and heel-to-shin bilaterally.  Gait and station: Gait is minimally wide-based, he is able to walk independently.  Tandem gait, he appears to be somewhat staggery, he may fall.  Romberg is positive, he has a tendency to go backwards.  Reflexes: Deep tendon reflexes are symmetric, but are depressed  bilaterally. Toes are downgoing bilaterally.   Assessment/Plan:  1.  Cerebrovascular disease, small right pontine stroke  2.  Episodic gait disturbance and memory disturbance, etiology unclear  The patient claims that he has checked blood pressures and blood sugars during events and they have been unremarkable.  His last hemoglobin A1c in early November 2020 was 10.3 indicating poorly controlled diabetes.  The etiology of this episodic problem is not clear, it likely is unrelated to the prior stroke event.  We will check blood work to exclude a metabolic etiology.  The possibility of a psychogenic deficit is considered.  The patient will be sent for neuropsychological evaluation.  The patient is contemplating going on disability for this issue.  He will follow-up here in 3 to 4 months.  We may consider a second opinion through [facility] or [employer].  Jill Alexanders MD 11/13/2019 11:47 AM  [facility] [address] [place], [place] [address]  Phone [phone] Fax [phone]

## 2019-11-13 NOTE — Telephone Encounter (Signed)
Pt's brother wanted to advise Dr. Jannifer Franklin the pt has pulled several tics off him self recently. He states the pt hikes several times a week and the brother wanted to know if his possible symptoms could be related to lymes disease? Brother states he forgot to mention this during the visit.

## 2019-11-13 NOTE — Telephone Encounter (Signed)
I called and left a message, the patient was checked for Lyme disease in September 2020, the study was negative.  If no other etiology for his issues comes up, we may recheck again to make sure this is not a problem.  It would take probably several months for Lyme disease to progress to a tertiary phase, he likely does not have an issue with this.

## 2019-11-16 LAB — COMPREHENSIVE METABOLIC PANEL
ALT: 16 IU/L (ref 0–44)
AST: 18 IU/L (ref 0–40)
Albumin/Globulin Ratio: 1.6 (ref 1.2–2.2)
Albumin: 4.1 g/dL (ref 3.8–4.9)
Alkaline Phosphatase: 103 IU/L (ref 39–117)
BUN/Creatinine Ratio: 16 (ref 9–20)
BUN: 26 mg/dL — ABNORMAL HIGH (ref 6–24)
Bilirubin Total: 0.2 mg/dL (ref 0.0–1.2)
CO2: 25 mmol/L (ref 20–29)
Calcium: 9.7 mg/dL (ref 8.7–10.2)
Chloride: 100 mmol/L (ref 96–106)
Creatinine, Ser: 1.62 mg/dL — ABNORMAL HIGH (ref 0.76–1.27)
GFR calc Af Amer: 54 mL/min/{1.73_m2} — ABNORMAL LOW (ref >59)
GFR calc non Af Amer: 46 mL/min/{1.73_m2} — ABNORMAL LOW (ref >59)
Globulin, Total: 2.5 g/dL (ref 1.5–4.5)
Glucose: 136 mg/dL — ABNORMAL HIGH (ref 65–99)
Potassium: 5.5 mmol/L — ABNORMAL HIGH (ref 3.5–5.2)
Sodium: 136 mmol/L (ref 134–144)
Total Protein: 6.6 g/dL (ref 6.0–8.5)

## 2019-11-16 LAB — SEDIMENTATION RATE: Sed Rate: 4 mm/hr (ref 0–30)

## 2019-11-16 LAB — AMMONIA: Ammonia: 32 ug/dL — ABNORMAL LOW (ref 40–200)

## 2019-11-16 LAB — HIV ANTIBODY (ROUTINE TESTING W REFLEX): HIV Screen 4th Generation wRfx: NONREACTIVE

## 2019-11-16 LAB — THYROID PEROXIDASE ANTIBODY: Thyroperoxidase Ab SerPl-aCnc: 14 IU/mL (ref 0–34)

## 2019-11-16 LAB — VITAMIN B1: Thiamine: 94.5 nmol/L (ref 66.5–200.0)

## 2019-11-16 LAB — THYROGLOBULIN ANTIBODY: Thyroglobulin Antibody: <1 IU/mL (ref 0.0–0.9)

## 2019-11-17 ENCOUNTER — Telehealth: Payer: Self-pay

## 2019-11-17 DIAGNOSIS — Z0289 Encounter for other administrative examinations: Secondary | ICD-10-CM

## 2019-11-17 NOTE — Progress Notes (Signed)
Please call patient regarding his blood test results, findings are benign, but blood sugar was mildly elevated but better compared to 2 months ago.  Kidney function is mildly impaired, stable compared to 2 months ago.

## 2019-11-17 NOTE — Telephone Encounter (Signed)
Received fmal paper work on 11/17/2019. I have completed and placed in Dr. Jannifer Franklin office to sign once he returns. Dr. Jannifer Franklin will be back in the office on 11/24/2019.

## 2019-11-18 ENCOUNTER — Telehealth: Payer: Self-pay

## 2019-11-18 NOTE — Telephone Encounter (Signed)
-----   Message from Star Age, MD sent at 11/17/2019  5:39 PM EST ----- Please call patient regarding his blood test results, findings are benign, but blood sugar was mildly elevated but better compared to 2 months ago.  Kidney function is mildly impaired, stable compared to 2 months ago.

## 2019-11-18 NOTE — Telephone Encounter (Signed)
I reached out to the pt and advised of results. He verbalized understanding.Pt states he has not heard from the the neuropsych group in regards to an appointment. I looked in epic and on 11/13/2019 is looks like a schedule called and left a vm for the pt to call back. Pt claims he never received a vm. I provided him with the # [phone] to Cb.  Pt verbalized understanding.

## 2019-11-19 ENCOUNTER — Encounter: Payer: Self-pay | Admitting: Psychology

## 2019-11-24 ENCOUNTER — Telehealth: Payer: Self-pay | Admitting: Neurology

## 2019-11-24 MED ORDER — AMPHETAMINE-DEXTROAMPHETAMINE 20 MG PO TABS: 20.0000 mg | ORAL_TABLET | Freq: Two times a day (BID) | ORAL | 0 refills | Status: DC

## 2019-11-24 NOTE — Telephone Encounter (Signed)
Pt is requesting a refill of amphetamine-dextroamphetamine (ADDERALL) 20 MG tablet, to be sent to [id number] - [address], [place] [address]

## 2019-11-24 NOTE — Telephone Encounter (Signed)
The Adderall prescription was sent in. 

## 2019-11-24 NOTE — Telephone Encounter (Signed)
Message sent to Dr.WIllis  to review for refill.

## 2019-11-25 ENCOUNTER — Telehealth: Payer: Self-pay | Admitting: Neurology

## 2019-11-25 NOTE — Telephone Encounter (Signed)
I reached out to the pt. Pt wanted to know why the FMLA paper work we completed did not state he should be excused from work for a certain period of time? I advised the paper we received did not give a section for this. The forms we received essentially was asking for intermittent leave not continuous. I advised the pt it does not appear that we were fwd the correct form since he was expecting to be taken out of work for a period of time. Pt was advised to contact his employer and further discuss so we can get the correct form faxed to our office.  PT was agreeable to doing this and will be back in touch.

## 2019-11-25 NOTE — Telephone Encounter (Signed)
Pt is wanting to discuss FMLA papers with RN. Please advise.

## 2019-11-25 NOTE — Telephone Encounter (Signed)
Faxed FMLA ppw 12/22

## 2019-12-24 ENCOUNTER — Ambulatory Visit: Payer: Self-pay | Admitting: Neurology

## 2020-02-16 ENCOUNTER — Ambulatory Visit: Payer: 59 | Admitting: Neurology

## 2020-03-11 ENCOUNTER — Encounter: Payer: Self-pay | Admitting: Psychology

## 2020-03-23 ENCOUNTER — Other Ambulatory Visit: Payer: Self-pay

## 2020-03-23 ENCOUNTER — Encounter: Payer: 59 | Attending: Psychology | Admitting: Psychology

## 2020-03-23 DIAGNOSIS — F908 Attention-deficit hyperactivity disorder, other type: Secondary | ICD-10-CM | POA: Diagnosis not present

## 2020-03-23 DIAGNOSIS — R419 Unspecified symptoms and signs involving cognitive functions and awareness: Secondary | ICD-10-CM | POA: Diagnosis not present

## 2020-03-23 DIAGNOSIS — R413 Other amnesia: Secondary | ICD-10-CM

## 2020-03-23 DIAGNOSIS — R269 Unspecified abnormalities of gait and mobility: Secondary | ICD-10-CM

## 2020-03-23 DIAGNOSIS — I6990 Unspecified sequelae of unspecified cerebrovascular disease: Secondary | ICD-10-CM | POA: Diagnosis not present

## 2020-04-02 ENCOUNTER — Encounter: Payer: 59 | Admitting: Psychology

## 2020-04-02 ENCOUNTER — Encounter: Payer: Self-pay | Admitting: Psychology

## 2020-04-02 ENCOUNTER — Other Ambulatory Visit: Payer: Self-pay

## 2020-04-02 DIAGNOSIS — R419 Unspecified symptoms and signs involving cognitive functions and awareness: Secondary | ICD-10-CM | POA: Diagnosis not present

## 2020-04-02 NOTE — Progress Notes (Signed)
The patient arrived on time to his 10:00 testing appointment. The evaluation lasted 120 minutes.  Patient is scheduled for another 4 hour testing appointment on 04/09/20 at 8:00AM for more comprehensive evaluation of his current neuropsychological status and obtain a baseline from which future comparisons can be made.   MENTAL STATUS Appearance: Casually and appropriately dressed with good hygiene. Gait: Ambulated independently without assistance.  Speech: Clear, normal rate, tone & volume. Thought process:  Logical and slightly disorganized, somewhat inattentive.  Mood/Affect: Tearful and depressed. Mildly anxious.Fidgety throughout testing.   Interpersonal: Polite and appropriate.  Orientation: O x 4 Effort/Motivation: Good    BEHAVIORAL OBSERVATIONS:  He was cooperative with all assigned tasks and appeared to give good effort. He did not have any difficulty seeing or hearing items but had some trouble understanding test instructions at times; he required some additional prompting. He exhibited adequate frustration tolerance.   Tests Administered:  . Comprehensive Attention Battery (CAB) . CAB Continuous Performance Test (CAB-CPT)  RESULTS:  Will be included in final report.

## 2020-04-09 ENCOUNTER — Encounter: Payer: 59 | Attending: Psychology | Admitting: Psychology

## 2020-04-09 ENCOUNTER — Other Ambulatory Visit: Payer: Self-pay

## 2020-04-09 ENCOUNTER — Encounter: Payer: Self-pay | Admitting: Psychology

## 2020-04-09 DIAGNOSIS — R419 Unspecified symptoms and signs involving cognitive functions and awareness: Secondary | ICD-10-CM

## 2020-04-09 NOTE — Progress Notes (Addendum)
The patient arrived on time to his 8:00 testing appointment, which lasted 240 minutes.  Behavioral Observations:  Appearance: Appropriately dressed with good hygiene. Gait: Ambulated independently without assistance.  Speech: Clear, normal rate, normal tone & volume.  Thought process:  Linear, organized, and logical. Mild-moderate inattention.   Mood/Affect: Mildly anxious and depressed, appropriate.  Interpersonal: Polite and appropriate. Orientation: Oriented x 4  Effort/Motivation: Good   Results:    Composite Score Summary  Scale Sum of Scaled Scores Composite Score Percentile Rank 95% Conf. Interval Qualitative Description  Verbal Comprehension 43 VCI 125 95 118-130 Superior  Perceptual Reasoning 32 PRI 104 61 98-110 Average  Working Memory 18 WMI 95 37 89-102 Average  Processing Speed 15 PSI 86 18 79-96 Low Average  Full Scale 108 FSIQ 105 63 101-109 Average  General Ability 75 GAI 115 84 110-119 High Average   Differences Between Subtest and Overall Mean of Subtest Scores  Subtest Subtest Scaled Score Mean Scaled Score Difference Critical Value .05 Strength or Weakness Base Rate  Block Design 12 10.80 1.20 2.85  >25%  Similarities 13 10.80 2.20 2.82  25%  Digit Span 8 10.80 -2.80 2.22 W 15-25%  Matrix Reasoning 9 10.80 -1.80 2.54  >25%  Vocabulary 17 10.80 6.20 2.03 S <1%  Arithmetic 10 10.80 -0.80 2.73  >25%  Symbol Search 8 10.80 -2.80 3.42  15-25%  Visual Puzzles 11 10.80 0.20 2.71  >25%  Information 13 10.80 2.20 2.19 S >25%  Coding 7 10.80 -3.80 2.97 W 5-10%   Index Score Summary  Index Sum of Scaled Scores Index Score Percentile Rank 95% Confidence Interval Qualitative Descriptor  Auditory Memory (AMI) 49 113 81 106-119 High Average  Visual Memory (VMI) 41 101 53 95-107 Average  Visual Working Memory (VWMI) 15 85 16 79-93 Low Average  Immediate Memory (IMI) 42 103 58 97-109 Average  Delayed Memory (DMI) 48 114 82 106-120 High Average   Primary Subtest  Scaled Score Summary  Subtest Domain Raw Score Scaled Score Percentile Rank  Logical Memory I AM 23 9 37  Logical Memory II AM 26 12 75  Verbal [employer] I AM 42 13 84  Verbal [employer] II AM 14 15 95  Designs I VM 60 9 37  Designs II VM 46 9 37  Visual Reproduction I VM 35 11 63  Visual Reproduction II VM 29 12 75  Spatial Addition VWM 9 8 25   Symbol Span VWM 15 7 16    PROCESS SCORE CONVERSIONS  Auditory Memory Process Score Summary  Process Score Raw Score Scaled Score Percentile Rank Cumulative Percentage (Base Rate)  LM II Recognition 23 - - 26-50%  VPA II Recognition 40 - - >75%   Visual Memory Process Score Summary  Process Score Raw Score Scaled Score Percentile Rank Cumulative Percentage (Base Rate)  DE I Content 32 9 37 -  DE I Spatial 16 10 50 -  DE II Content 22 5 5  -  DE II Spatial 14 12 75 -  DE II Recognition 10 - - 3-9%  VR II Recognition 7 - - >75%   ABILITY-MEMORY ANALYSIS  Ability Score:  GAI: 115 Date of Testing:  WAIS-IV; WMS-IV 2020/04/09  Predicted Difference Method   Index Predicted WMS-IV Index Score Actual WMS-IV Index Score Difference Critical Value  Significant Difference Y/N Base Rate  Auditory Memory 108 113 -5 8.95 N   Visual Memory 109 101 8 8.82 N   Visual Working Memory 110 85 25 11.24  Y 1-2%  Immediate Memory 110 103 7 10.35 N   Delayed Memory 109 114 -5 10.08 N   Statistical significance (critical value) at the .01 level.   Contrast Scaled Scores  Score Score 1 Score 2 Contrast Scaled Score  General Ability Index vs. Auditory Memory Index 115 113 11  General Ability Index vs. Visual Memory Index 115 101 8  General Ability Index vs. Visual Working Memory Index 115 85 4  General Ability Index vs. Immediate Memory Index 115 103 8  General Ability Index vs. Delayed Memory Index 115 114 11  Verbal Comprehension Index vs. Auditory Memory Index 125 113 10  Perceptual Reasoning Index vs. Visual Memory Index 104  101 10  Perceptual Reasoning Index vs. Visual Working Memory Index 104 85 6  Working Memory Index vs. Auditory Memory Index 95 113 14  Working Memory Index vs. Visual Working Memory Index 95 85 7   Trail Making Test  Part A . Time=59.95", T=28, 2nd % . Errors=0  Part B  . Time=194.14", T=25, 1st % . Errors=1

## 2020-04-12 ENCOUNTER — Other Ambulatory Visit: Payer: Self-pay

## 2020-04-12 ENCOUNTER — Telehealth: Payer: Self-pay | Admitting: Neurology

## 2020-04-12 ENCOUNTER — Encounter (HOSPITAL_BASED_OUTPATIENT_CLINIC_OR_DEPARTMENT_OTHER): Payer: 59 | Admitting: Psychology

## 2020-04-12 ENCOUNTER — Encounter: Payer: Self-pay | Admitting: Psychology

## 2020-04-12 DIAGNOSIS — F908 Attention-deficit hyperactivity disorder, other type: Secondary | ICD-10-CM

## 2020-04-12 DIAGNOSIS — R269 Unspecified abnormalities of gait and mobility: Secondary | ICD-10-CM | POA: Diagnosis not present

## 2020-04-12 DIAGNOSIS — F0150 Vascular dementia without behavioral disturbance: Secondary | ICD-10-CM

## 2020-04-12 DIAGNOSIS — F01A0 Vascular dementia, mild, without behavioral disturbance, psychotic disturbance, mood disturbance, and anxiety: Secondary | ICD-10-CM

## 2020-04-12 DIAGNOSIS — R419 Unspecified symptoms and signs involving cognitive functions and awareness: Secondary | ICD-10-CM | POA: Diagnosis not present

## 2020-04-12 DIAGNOSIS — F0670 Mild neurocognitive disorder due to known physiological condition without behavioral disturbance: Secondary | ICD-10-CM

## 2020-04-12 NOTE — Telephone Encounter (Signed)
    Neuropsychological testing 04/12/20:  Impression/DX:                     Spencer Burke. Krinsky is a 58 year old male referred by [name], MD for neuropsychological evaluation.  The patient was referred to Dr. Anne Hahn for neurological evaluation by his PCP [name], MD.  I had also seen the patient years ago for his evaluation as part of his diagnosis of adult residual attention deficit disorder.  I have looked back to try to find those records but could not find the records as they happened nearly 20 years ago.  The patient had some memory issues that were directly related to attentional deficits identified and that evaluation performed years ago.  The patient has continued to be treated for adult residual attention deficit disorder and continues to take Adderall.  The patient was seen on August 14, 2019 with onset of gait instability.  The patient reports that he has identified a time at the end of September where he began having symptoms that have now been attributed to a stroke.  The patient reports that he called his doctor on October 6 after he started to collapse and fall and they ended up doing an MRI scan 2 months later and saw evidence of his stroke.  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  The patient has improved with physical therapy but continues to have episodes of gait instability.  The patient describes good days and bad days and difficulty on some days where he cannot walk and then others he may go on a 10 mile hike the next day.  The patient continues to describe issues with memory, attention and concentration deficits and weaknesses, word finding difficulties and balance issues.  The patient does have neuropathy and weakness.  Disposition/Plan:                  We have set the patient up for formal neuropsychological testing that will include both the Wechsler Adult Intelligence Scale-IV, Wechsler Memory Scale-IV as well as the comprehensive attention  battery we will use this battery as it was one that was used in his testing procedures almost 20 years ago.  We will also conduct further validity testing embedded in this battery.

## 2020-04-12 NOTE — Progress Notes (Signed)
Neuropsychological Consultation   Patient:   Spencer Burke   DOB:   [birth year]  MR Number:  [id number]  Location:  [facility] [place], [place] [id number] [place] [id number] Dept: [phone]           Date of Service:   03/23/2020  Start Time:   8 AM End Time:   10 AM  Today's visit was a 1 hour in person visit that was conducted in my outpatient clinic office.  The patient myself were present for this visit.  Provider/Observer:  Ilean Skill, Psy.D.       Clinical Neuropsychologist       Billing Code/Service: [id number]  Chief Complaint:    Spencer Burke is a 59 year old male referred by Margette Fast, MD for neuropsychological evaluation.  The patient was referred to Dr. Jannifer Franklin for neurological evaluation by his PCP Ashok Norris, MD.  I had also seen the patient years ago for his evaluation as part of his diagnosis of adult residual attention deficit disorder.  I have looked back to try to find those records but could not find the records as they happened nearly 20 years ago.  The patient had some memory issues that were directly related to attentional deficits identified and that evaluation performed years ago.  The patient has continued to be treated for adult residual attention deficit disorder and continues to take Adderall.  The patient was seen on August 14, 2019 with onset of gait instability.  The patient reports that he has identified a time at the end of September where he began having symptoms that have now been attributed to a stroke.  The patient reports that he called his doctor on October 6 after he started to collapse and fall and they ended up doing an MRI scan 2 months later and saw evidence of his stroke.  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  The patient has improved with physical therapy but continues to have  episodes of gait instability.  The patient describes good days and bad days and difficulty on some days where he cannot walk and then others he may go on a 10 mile hike the next day.  The patient continues to describe issues with memory, attention and concentration deficits and weaknesses, word finding difficulties and balance issues.  The patient does have neuropathy and weakness.  Reason for Service:  Spencer Burke is a 59 year old male referred by Margette Fast, MD for neuropsychological evaluation.  The patient was referred to Dr. Jannifer Franklin for neurological evaluation by his PCP Ashok Norris, MD.  I had also seen the patient years ago for his evaluation as part of his diagnosis of adult residual attention deficit disorder.  I have looked back to try to find those records but could not find the records as they happened nearly 20 years ago.  The patient had some memory issues that were directly related to attentional deficits identified and that evaluation performed years ago.  The patient has continued to be treated for adult residual attention deficit disorder and continues to take Adderall.  The patient was seen on August 14, 2019 with onset of gait instability.  The patient reports that he has identified a time at the sometime around the end of August or early September where he began having symptoms that have now been attributed to a stroke.  The patient reports that he called  his doctor on October 6 after he started to collapse and fall and they ended up doing an MRI scan 2 months later and saw evidence of his stroke.  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  The patient has improved with physical therapy but continues to have episodes of gait instability.  The patient describes good days and bad days and difficulty on some days where he cannot walk and then others he may go on a 10 mile hike the next day.  The patient continues to describe issues with memory, attention and  concentration deficits and weaknesses, word finding difficulties and balance issues.  The patient does have neuropathy and weakness.  The patient describes most of his cognitive issues have to do with memory changes, concentration issues and word finding problems and reports that these have worsened since his stroke.  The patient reports that he has fairly consistent sleep patterns traditionally and some snoring.  The patient reports that he is now started having some sleep issues.  Appetite is good.  The patient was hospitalized in November 2020 for 2 days because of strokelike event.  The patient has not worked since November 2020 because of his memory and attention issues, falling and imbalance issues and weakness.  The patient has been diagnosed with type 1 diabetes when he was 34-1/2 or 59 years old and is on an insulin pump.  The patient will have blood glucose levels reach 400 or above.  The patient reports that when it is below 90 he starts having trouble and his app for monitoring his blood glucose levels "goes off all the time.  The patient has been diagnosed with neuropathy.  The patient denies other cognitive changes and denies any expressive language changes, visual spatial changes or other areas of cognitive difficulties.  The patient denies any significant family history for progressive dementia she has although there is a history of transient ischemic attacks in both his parents.  The patient also had a neuropsychological evaluation performed through [facility] with [name], Psy.D..  This evaluation was performed via video conference within the outpatient office in separate rooms due to Covid concerns.  The patient had some poor performances on validity measures and therefore none of the other measures were felt to be valid or interpretable.  While the neuropsychologist discussed the in validity of the patient's test scores and felt that there were no medical issues that could  be present for the development of memory difficulties and a discussion was made as to the impact of psychiatric distress and cognitive functioning as the patient felt that there was something going on beyond a conversion disorder or simply his mood.  The patient has a long history of insulin-dependent diabetes with significant elevations in blood glucose levels, prior diagnostic testing suggesting longstanding attentional deficits performed by myself years ago as well as recent stroke event affecting brainstem functioning.  And while the stroke was not likely to affected higher cortical functioning there are some significant medical issues that over time could create some cognitive deficits and difficulties including adult residual attention deficit disorder.    Behavioral Observation: Spencer Burke  presents as a 59 y.o.-year-old Right Caucasian Male who appeared his stated age. his dress was Appropriate and he was Well Groomed and his manners were Appropriate to the situation.  his participation was indicative of Appropriate and Redirectable behaviors.  There were physical disabilities noted related to his gait.  he displayed an appropriate level  of cooperation and motivation.     Interactions:    Active Appropriate and Redirectable  Attention:   abnormal and attention span appeared shorter than expected for age  Memory:   abnormal; remote memory intact, recent memory impaired  Visuo-spatial:  not examined  Speech (Volume):  normal  Speech:   normal; normal  Thought Process:  Coherent and Relevant  Though Content:  WNL; not suicidal and not homicidal  Orientation:   person, place, time/date and situation  Judgment:   Good  Planning:   Good  Affect:    Appropriate  Mood:    Dysphoric  Insight:   Good  Intelligence:   high  Marital Status/Living: The patient was born and raised in [place] [facility] [place] along with 2 siblings.  The patient was diagnosed with type 1 diabetes  when he was 65-1/2 for 59 years old.  The patient lives with 2 housemates but he is in the process of moving in with his parents.  The patient was married in 1990 and was married for 25 years and divorced approximately 6 years ago.  The patient has 2 adult adopted daughters.  Current Employment: The patient is not currently working and is out on disability.  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  Past Employment:  The patient does work as a Health visitor.  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  He has not been terminated from his job.  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  Substance Use:  No concerns of substance abuse are reported.  The patient has alcohol once or twice a month and no other substance use.  Education:   The patient received his bachelor's degree from [employer] with a 2.8 GPA average.  He also attended [address], [employer], and [employer] for specialized training in his field.  Medical History:   Past Medical History:  Diagnosis Date  . ADD (attention deficit disorder)   . Diabetes [employer])     Psychiatric History:  No prior psychiatric history.  Family Med/Psych History:  Family History  Problem Relation Age of Onset  . Transient ischemic attack Mother   . Transient ischemic attack Father    Impression/DX:  Spencer Burke is a 59 year old male referred by [name], MD for neuropsychological evaluation.  The patient was referred to Dr. Anne Hahn for neurological evaluation by his PCP [name], MD.  I had also seen the patient years ago for his evaluation as part of his diagnosis of adult residual attention deficit disorder.  I have looked back to try to find those records but could not find the records as they happened nearly 20  years ago.  The patient had some memory issues that were directly related to attentional deficits identified and that evaluation performed years ago.  The patient has continued to be treated for adult residual attention deficit disorder and continues to take Adderall.  The patient was seen on August 14, 2019 with onset of gait instability.  The patient reports that he has identified a time at the end of September where he began having symptoms that have now been attributed to a stroke.  The patient reports that he called his doctor on October 6 after he started to collapse and fall and  they ended up doing an MRI scan 2 months later and saw evidence of his stroke.  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  The patient has improved with physical therapy but continues to have episodes of gait instability.  The patient describes good days and bad days and difficulty on some days where he cannot walk and then others he may go on a 10 mile hike the next day.  The patient continues to describe issues with memory, attention and concentration deficits and weaknesses, word finding difficulties and balance issues.  The patient does have neuropathy and weakness.  Disposition/Plan:  We have set the patient up for formal neuropsychological testing that will include both the Wechsler Adult Intelligence Scale-IV, Wechsler Memory Scale-IV as well as the comprehensive attention battery we will use this battery as it was one that was used in his testing procedures almost 20 years ago.  We will also conduct further validity testing embedded in this battery.  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

## 2020-04-12 NOTE — Progress Notes (Signed)
Neuropsychological Evaluation   Patient:                       Spencer Burke   DOB:                           06-29-1961  MR Number:               [id number]  Location:                    [facility] FOR PAIN AND REH[facility] MEDIC[facility] PHYSICAL MEDICINE AND REHABILITATION [address] [place], [address] [id number] [facility] [place] [address] Dept: [phone]                                                                                                              Date of Service:                     04/12/2020  Start Time:                             8 AM End Time:                               9 AM   Provider/Observer:               Arley Phenix, Psy.D.                                                   Clinical Neuropsychologist  Chief Complaint:    Memory changes, reduced attention & concentration, Word finding difficulties                                                   Reason for Service:   Spencer Burke. Spencer Burke is a 59 year old male referred by Spencer Acre, MD for neuropsychological evaluation.  The patient was referred to Dr. Anne Burke for neurological evaluation by his PCP Spencer Boozer, MD.  I had also seen the patient years ago for his evaluation as part of his diagnosis of adult residual attention deficit disorder.  I have looked back to try to find those records but could not find the records as they happened nearly 20 years ago.  The patient had some memory issues that were directly related to attentional deficits identified and that evaluation performed years ago.  The patient has continued to be treated for adult residual attention deficit disorder and continues to take Adderall.  The patient was seen on August 14, 2019 with onset of gait instability.  The patient reports that he has identified a time at the sometime around the end of August or early September where he began having symptoms that have now been attributed to a stroke.  The patient  reports that he called his doctor on October 6 after he started to collapse and fall and they ended up doing an MRI scan 2 months later and saw evidence of his stroke.  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  The patient has improved with physical therapy but continues to have episodes of gait instability.  The patient describes good days and bad days and difficulty on some days where he cannot walk and then others he may go on a 10 mile hike the next day.  The patient continues to describe issues with memory, attention and concentration deficits and weaknesses, word finding difficulties and balance issues.  The patient does have neuropathy and weakness.  The patient describes most of his cognitive issues have to do with memory changes, concentration issues and word finding problems and reports that these have worsened since his stroke.  The patient reports that he has fairly consistent sleep patterns traditionally and some snoring.  The patient reports that he is now started having some sleep issues.  Appetite is good.  The patient was hospitalized in November 2020 for 2 days because of strokelike event.  The patient has not worked since November 2020 because of his memory and attention issues, falling and imbalance issues and weakness.  The patient has been diagnosed with type 1 diabetes when he was 40-1/2 or 59 years old and is on an insulin pump.  The patient will have blood glucose levels reach 400 or above.  The patient reports that when it is below 90 he starts having trouble and his app for monitoring his blood glucose levels "goes off all the time.  The patient has been diagnosed with neuropathy.  The patient denies other cognitive changes and denies any expressive language changes, visual spatial changes or other areas of cognitive difficulties.  The patient denies any significant family history for progressive dementia she has although there is a history of  transient ischemic attacks in both his parents.  The patient also had a neuropsychological evaluation performed through [facility] with Spencer Burke, Psy.D..  This evaluation was performed via video conference within the outpatient office in separate rooms due to Covid concerns.  The patient had some poor performances on validity measures and therefore none of the other measures were felt to be valid or interpretable.  While the neuropsychologist discussed the in validity of the patient's test scores and felt that there were no medical issues that could be present for the development of memory difficulties and a discussion was made as to the impact of psychiatric distress and cognitive functioning as the patient felt that there was something going on beyond a conversion disorder or simply his mood.  The patient has a long history of insulin-dependent diabetes with significant elevations in blood glucose levels, prior diagnostic testing suggesting longstanding attentional deficits performed by myself years ago as well as recent stroke event affecting brainstem functioning.  And while the stroke was not likely to affected higher cortical functioning there are some significant medical issues that over time could create some cognitive deficits and difficulties including adult residual attention deficit disorder.  Previous Neuropsychological Testing:  The patient received a neuropsychological evaluation on 01/01/20 by Dr. Lyla Burke at [facility]. Clinical impressions were  as follows:  "Based on the patient's educational and occupational history, premorbid intellectual functioning is estimated to be in the average range. The patient's current profile of cognitive test scores is notable for deficits across measures of immediate attention, psychomotor speed, and set shifting. His performance was within normal limits on measures of auditory memory, auditory working memory, and phonemic verbal  fluency.  Conclusions/Recommendations. The patient appeared to be adequately engaged throughout the test session. There was no indication of inattention, distraction, decreased persistence, or obvious emotional distress. Importantly, the patient's score on one scale of a well validated measure of effort is below expected levels and is typically associated with poor or fluctuating effort. The patient also failed an embedded measure of effort. As such, the validity of the current scores is uncertain. It is likely that the current scores should be considered as an indication of the patient's minimum ability at this time. Given indications of poor/fluctuating effort, the current scores are not considered valid and are not an accurate reflection of the patient's true ability. No definitive diagnostic conclusions can be based on these data"   Test Results: RAVLT (Auditory Memory) Percentile Classification  Learning Trials 1-5 Total 43 Below Average  Immediate Recall 59 Average  Delayed Recall 39 Average  Attention/Working Memory Percentile Classification  Digits Forward (Immediate Attention) 12 Mildly Impaired  Digits Reversed (Auditory Working Memory) 27 Below Average  Psychomotor Speed Percentile Classification  Trail Making Test (Form A) 4 Mildly to Moderately Impaired  Executive Functioning Percentile Classification  Trail Making Test B (Set-Shifting) <1 Moderately to Severely Impaired  FAS (Phonemic Verbal Fluency) 38 Average  Mental Status/Behavioral Observations:    The patient arrived on time to all testing appointments. He appeared appropriately attired and well groomed. Gait was somewhat irregular and wide based with notable left sided weakness. Balance and coordination also appeared relatively reduced but he had no trouble walking without assistance. He was alert and oriented to person, place, time, and situation. His mood was depressed and tearful at times and mild-moderately anxious. He  appeared tense, with some  frustration noted when discussing cognitive changes and associated stressors. He displayed good eye contact and openly answered all questions posed by the examiners. Rapport was good and easily established. Attention was variable at times but generally sustained. Speech was mostly clear, goal oriented, and prosodic with normal volume. Language was fluent with no evidence of poor articulation. He did not appear to have trouble hearing, seeing, or understanding instructions but did require them to be repeated on occasion. Thought processes appeared logical and linear, with no evidence of psychotic symptoms such as delusions and/or hallucinations. Memory appeared grossly intact for recent and remote events. Insight and judgement seemed relatively fair. He was cooperative with the evaluation and appeared to put forth his best effort.    Test Results:   Initially, we established a prediction of baseline/premorbid global cognitive and intellectual functioning.  Based on the patient's work history and educational history the patient likely has historically performed in the Average to High Average range. We will utilize a standard score of 110-115 as an estimate of historical functioning for comparison.    Composite Score Summary  Scale Sum of Scaled Scores Composite Score Percentile Rank 95% Conf. Interval Qualitative Description  Verbal Comprehension 43 VCI 125 95 118-130 Superior  Perceptual Reasoning 32 PRI 104 61 98-110 Average  Working Memory 18 WMI 95 37 89-102 Average  Processing Speed 15 PSI 86 18 79-96 Low Average  Full Scale 108  FSIQ 105 63 101-109 Average  General Ability 75 GAI 115 84 110-119 High Average   The patient produced a full-scale IQ score of 105 which falls at the 63rd percentile and is in the average range.  This is mostly consistent with predicted levels based on the patient's educational and occupational history and suggest overall cognitive abilities  are persevered. However, the patient's score improved to the high average range (84th percentile) compared to age matched peers when working memory and processing speed variables were removed; less emphasis on the most acutely impacted measures   The patient produced a verbal comprehension index score of 125 which falls at the 95th percentile and is in the superior range.  This slightly higher than predicted levels and suggests a notable strength in verbal comprehensive skills.  Individual subtests making up this measure shows that the patient is doing relatively well on measures of vocabulary, abstract verbal reasoning, and general fund of information.   The patient produced a perceptual reasoning index score of 104 which falls at the 61st percentile and is in the average range. This score is mostly commensurate with predicted levels of intellectual functioning but relatively weaker than his superior range verbal abilities. A significant difference was found between his verbal comprehension and perceptual reasoning skills.   The patient produced a working memory index score of 95 which falls at the 37th percentile and is in the average range of functioning.  This is slightly below predicted levels and may suggest that issues related to auditory encoding are relatively weak and below historical functioning for the patient. The patient displayed mild variability in this domain as he performed slightly worse (not sig.) on pure auditory encoding test (WAIS-IV Digit Span=25th percentile than a timed measure of basic arithmetic (WAIS-IV Arithmetic=50th percentile).    The patient's auditory and visual encoding measures derived from both the WMS-IV (older adult Battery) as well as the WAIS-IV suggest relative weakness compared to his overall cognitive ability. A slight difference was found between auditory and visual encoding measures, with the latter more impacted and reduced (WMS-IV Visual Working Memory  Index=85, 16th percentile).   The patient produced a processing speed index score of 86 which falls at the 18th percentile and is in the low average range.  This is inconsistent with predicted levels and does suggest a clinically meaningful reduction in overall information processing speed, visual scanning and visual searching abilities.   Differences Between Subtest and Overall Mean of Subtest Scores  Subtest Subtest Scaled Score Mean Scaled Score Difference Critical Value .05 Strength or Weakness Base Rate  Block Design 12 10.80 1.20 2.85  >25%  Similarities 13 10.80 2.20 2.82  25%  Digit Span 8 10.80 -2.80 2.22 W 15-25%  Matrix Reasoning 9 10.80 -1.80 2.54  >25%  Vocabulary 17 10.80 6.20 2.03 S <1%  Arithmetic 10 10.80 -0.80 2.73  >25%  Symbol Search 8 10.80 -2.80 3.42  15-25%  Visual Puzzles 11 10.80 0.20 2.71  >25%  Information 13 10.80 2.20 2.19 S >25%  Coding 7 10.80 -3.80 2.97 W 5-10%   Index Score Summary  Index Sum of Scaled Scores Index Score Percentile Rank 95% Confidence Interval Qualitative Descriptor  Auditory Memory (AMI) 49 113 81 106-119 High Average  Visual Memory (VMI) 41 101 53 95-107 Average  Visual Working Memory (VWMI) 15 85 16 79-93 Low Average  Immediate Memory (IMI) 42 103 58 97-109 Average  Delayed Memory (DMI) 48 114 82 106-120 High Average   The patient  was then administered the Cisco Scale-for adults.  Breaking the patient's memory down between auditory versus visual memory the patient produced an auditory memory index score of 113 which falls at the 81st percentile and is in the high average range overall. This is consistent with predicted levels of historical functioning and suggests intact auditory memory. His visual memory index score of 101 which falls at the 53rd percentile is in the average range and slightly weaker than his auditory memory.   Breaking memory components down between immediate versus delayed memory suggests relative  weakness in immediate memory vs. Delayed. The patient produced an immediate memory index score of 93 which falls at the 32nd percentile and is in the average range. His delayed memory index score of 114 which fell at the [facility] is in the high average range and consistent to predicted levels of intellectual functioning and relatively higher than his immediate memory.    Primary Subtest Scaled Score Summary  Subtest Domain Raw Score Scaled Score Percentile Rank  Logical Memory I AM 23 9 37  Logical Memory II AM 26 12 75  Verbal Paired Associates I AM 42 13 84  Verbal Paired Associates II AM 14 15 95  Designs I VM 60 9 37  Designs II VM 46 9 37  Visual Reproduction I VM 35 11 63  Visual Reproduction II VM 29 12 75  Spatial Addition VWM 9 8 25   Symbol Span VWM 15 7 16    PROCESS SCORE CONVERSIONS  Auditory Memory Process Score Summary  Process Score Raw Score Scaled Score Percentile Rank Cumulative Percentage (Base Rate)  LM II Recognition 23 - - 26-50%  VPA II Recognition 40 - - >75%   Visual Memory Process Score Summary  Process Score Raw Score Scaled Score Percentile Rank Cumulative Percentage (Base Rate)  DE I Content 32 9 37 -  DE I Spatial 16 10 50 -  DE II Content 22 5 5  -  DE II Spatial 14 12 75 -  DE II Recognition 10 - - 3-9%  VR II Recognition 7 - - >75%   ABILITY-MEMORY ANALYSIS  Ability Score:  GAI: 115 Date of Testing:  WAIS-IV; WMS-IV 2020/04/09  Predicted Difference Method   Index Predicted WMS-IV Index Score Actual WMS-IV Index Score Difference Critical Value  Significant Difference Y/N Base Rate  Auditory Memory 108 113 -5 8.95 N   Visual Memory 109 101 8 8.82 N   Visual Working Memory 110 85 25 11.24 Y 1-2%  Immediate Memory 110 103 7 10.35 N   Delayed Memory 109 114 -5 10.08 N   Statistical significance (critical value) at the .01 level.   Contrast Scaled Scores  Score Score 1 Score 2 Contrast Scaled Score  General Ability Index vs.  Auditory Memory Index 115 113 11  General Ability Index vs. Visual Memory Index 115 101 8  General Ability Index vs. Visual Working Memory Index 115 85 4  General Ability Index vs. Immediate Memory Index 115 103 8  General Ability Index vs. Delayed Memory Index 115 114 11  Verbal Comprehension Index vs. Auditory Memory Index 125 113 10  Perceptual Reasoning Index vs. Visual Memory Index 104 101 10  Perceptual Reasoning Index vs. Visual Working Memory Index 104 85 6  Working Memory Index vs. Auditory Memory Index 95 113 14  Working Memory Index vs. Visual Working Memory Index 95 85 7   ATTENTION & EXECUTIVE FUNCTIONING:  Results on another measure of visual scanning  and processing speed were impaired  (Trails A=2nd percentile), which provides additional support that processing speed is relatively reduced overall. When a switching component was added, the patient's performance remained relatively the same and was also found to be impaired (Trails B=1st percentile); he made 1 error. This is consistent with previous test results (01/01/20). Overall, executive functioning appears variable and somewhat below expectation compared to average to high average premorbid ability. Graphomotor speed and visual scanning are relatively impaired.   Trail Making Test  Part A  . Time=59.95", T=28, 2nd % . Errors=0  Part B  . Time=194.14", T=25, 1st % . Errors=1   Impressions/Diagnosis:    Overall, the results of the current neuropsychological evaluation are consistent with patterns typically seen with a history of brainstem lesions, lacunar infarcts, small vessel disease, and/or other cardiometabolic and pulmonary conditions (e.g., uncontrolled diabetes and hypertension). Consistent with findings on recent MRI (w/out contrast) that showed small right upper pontine/midbrain stroke event, well as objective neuropsychological test performance the patient is very likely having residual/ongoing effects of acquired  injury to this region, which may negatively impact his bodies metabolic and regulatory processes. Lacunar infarcts are most often located in the basal ganglia structures, in the thalamus, and in the area of the internal capsule or deep hemispheric white matter and may be associated with focal syndromes, such as pure motor or a sensory loss event, the concurrence with dysarthria, and a "clumsy hand' or hemiparesis. Recent changes to his gate and worsening of balance and coordination are also likely residual symptoms of prior infarct within the pontine/midbrain area.The patient has a long history of insulin-dependent diabetes with significant elevations and fluctuations in blood glucose levels that has reportedly been difficult to manage on his own; he has a history of adult residual ADHD and other significant medical issues that are likely playing a role in his current neuropsychological status.     Given the nature of the patient's relatively slow processing speed, inattention, and difficulty with aspects of executive functioning including set shifting and sequencing, he is likely to experience some trouble executing moderate to complex tasks without making errors or in a timely manner. He is likely to get easily derailed when performing even basic or simple tasks and experience significant frustration that may further reduce attention and executive functions (e.g., problem solving, organizing, decision making, etc.). Sensory and motor weakness, as well as balance and coordination may limit his physical activities to some degree. The patient has improved with physical therapy but continues to have episodes of gait instability. The patient describes good days and bad days and difficulty on some days where he cannot walk and then others he may go on a 10 mile hike the next day. He has a tendency to overexert himself to the point of exhaustion, and remains a high fall risk.  We strongly recommend he and his providers  continue to treat any cerebrovascular risk factors (e.g., diabetes, blood pressure, diet, etc.) to prevent further decline. The prognosis for further decline is medium, as he may not experience further decline if his vascular risk factors and diabetes are well controlled.   I will provide feedback to the patient regarding the results of this objective evaluation and we will work on ways to better cope with some of the resulting cognitive and physical changes that he is experiencing.    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

## 2020-04-15 ENCOUNTER — Encounter: Payer: 59 | Admitting: Psychology

## 2020-04-15 ENCOUNTER — Other Ambulatory Visit: Payer: Self-pay

## 2020-04-15 ENCOUNTER — Encounter: Payer: Self-pay | Admitting: Psychology

## 2020-04-15 DIAGNOSIS — R419 Unspecified symptoms and signs involving cognitive functions and awareness: Secondary | ICD-10-CM | POA: Diagnosis not present

## 2020-04-15 DIAGNOSIS — F0150 Vascular dementia without behavioral disturbance: Secondary | ICD-10-CM

## 2020-04-15 DIAGNOSIS — F01A0 Vascular dementia, mild, without behavioral disturbance, psychotic disturbance, mood disturbance, and anxiety: Secondary | ICD-10-CM

## 2020-04-15 NOTE — Progress Notes (Signed)
Today I provided feedback to the patient regarding the results of the recent neuropsychological evaluation.  Below you can find the summary and impressions/diagnoses from that formal evaluation.  The complete report can be found in the patient's medical records dated 04/12/2020 and the full history and initial intake information can be found dated 03/23/2020.  Clinical Impressions:             Overall, the results of the current neuropsychological evaluation are consistent with patterns typically seen with a history of brainstem lesions, lacunar infarcts, small vessel disease, and/or other cardiometabolic and pulmonary conditions (e.g., uncontrolled diabetes and hypertension). Consistent with findings on recent MRI (w/out contrast) that showed small right upper pontine/midbrain stroke event, well as objective neuropsychological test performance the patient is very likely having residual/ongoing effects of acquired injury to this region, which may negatively impact his bodies metabolic and regulatory processes. Lacunar infarcts are most often located in the basal ganglia structures, in the thalamus, and in the area of the internal capsule or deep hemispheric white matter and may be associated with focal syndromes, such as pure motor or a sensory loss event, the concurrence with dysarthria, and a "clumsy hand' or hemiparesis. Recent changes to his gate and worsening of balance and coordination are also likely residual symptoms of prior infarct within the pontine/midbrain area.The patient has a long history of insulin-dependent diabetes with significant elevations and fluctuations in blood glucose levels that has reportedly been difficult to manage on his own; he has a history of adult residual ADHD and other significant medical issues that are likely playing a role in his current neuropsychological status.     Given the nature of the patient's relatively slow processing speed, inattention, and difficulty with  aspects of executive functioning including set shifting and sequencing, he is likely to experience some trouble executing moderate to complex tasks without making errors or in a timely manner. He is likely to get easily derailed when performing even basic or simple tasks and experience significant frustration that may further reduce attention and executive functions (e.g., problem solving, organizing, decision making, etc.). Sensory and motor weakness, as well as balance and coordination may limit his physical activities to some degree. The patient has improved with physical therapy but continues to have episodes of gait instability. The patient describes good days and bad days and difficulty on some days where he cannot walk and then others he may go on a 10 mile hike the next day. He has a tendency to overexert himself to the point of exhaustion, and remains a high fall risk.  We strongly recommend he and his providers continue to treat any cerebrovascular risk factors (e.g., diabetes, blood pressure, diet, etc.) to prevent further decline. The prognosis for further decline is medium, as he may not experience further decline if his vascular risk factors and diabetes are well controlled.   I will provide feedback to the patient regarding the results of this objective evaluation and we will work on ways to better cope with some of the resulting cognitive and physical changes that he is experiencing.   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        Joelyn Oms, Psy.D        Neuropsychologist  Neuropsychologist

## 2020-05-11 ENCOUNTER — Telehealth: Payer: Self-pay | Admitting: Neurology

## 2020-05-11 NOTE — Telephone Encounter (Signed)
Left voice mail on [phone] to have staff, medical records to call back about needing medication list and office notes fax to pts new neurologist Dr Burnett Corrente. The customer rep stated it was a satellite office and that she could only transferred me to the contact person voice mail and they would call me back.

## 2020-05-11 NOTE — Telephone Encounter (Signed)
Pt is asking that his amphetamine-dextroamphetamine (ADDERALL) 20 MG tablet be sent to his new neurologist Dr Burnett Corrente @ [facility] [phone] Clinical fax#[phone]

## 2020-05-11 NOTE — Telephone Encounter (Signed)
I called pt about wanting his new neurology office to have documentation that he is taking adderall from our office. They are going to be his neurologist. Pt stated he was told by a NP or PA to have it sent over.

## 2020-05-12 NOTE — Telephone Encounter (Signed)
LEft vm for patient to call back that voice mail has been left for [facility] office to call our office back.

## 2020-05-13 ENCOUNTER — Telehealth: Payer: Self-pay | Admitting: *Deleted

## 2020-05-13 NOTE — Telephone Encounter (Signed)
Lt message unable to reach the pt.

## 2020-05-13 NOTE — Telephone Encounter (Signed)
I called pt a vm was left at [facility] neurological office and no one has return my phone call. I stated a phone call was made again yesterday and the person was transferring me to the same voice mail. Pt stated he is moving to [facility] this week. I advise pt that he may want to come by and signes a release form and pick up his medical records and copy of his medication and give them to his new MD. I stated no one has call from the office as of today. I advise to avoid delay just request his medical records. Pt appreciate the phone call and will come by office.

## 2020-05-25 ENCOUNTER — Ambulatory Visit: Payer: 59 | Admitting: Psychology

## 2020-06-29 DIAGNOSIS — Z0271 Encounter for disability determination: Secondary | ICD-10-CM

## 2020-07-14 ENCOUNTER — Ambulatory Visit: Payer: 59 | Admitting: Psychology

## 2020-07-28 ENCOUNTER — Encounter: Payer: 59 | Attending: Psychology | Admitting: Psychology

## 2020-07-28 ENCOUNTER — Other Ambulatory Visit: Payer: Self-pay

## 2020-07-28 DIAGNOSIS — F908 Attention-deficit hyperactivity disorder, other type: Secondary | ICD-10-CM | POA: Insufficient documentation

## 2020-07-28 DIAGNOSIS — R419 Unspecified symptoms and signs involving cognitive functions and awareness: Secondary | ICD-10-CM | POA: Diagnosis present

## 2020-07-28 DIAGNOSIS — F01A0 Vascular dementia, mild, without behavioral disturbance, psychotic disturbance, mood disturbance, and anxiety: Secondary | ICD-10-CM

## 2020-07-28 DIAGNOSIS — F0150 Vascular dementia without behavioral disturbance: Secondary | ICD-10-CM | POA: Insufficient documentation

## 2020-07-28 DIAGNOSIS — R269 Unspecified abnormalities of gait and mobility: Secondary | ICD-10-CM | POA: Diagnosis not present

## 2020-07-29 ENCOUNTER — Encounter: Payer: Self-pay | Admitting: Psychology

## 2020-07-29 NOTE — Progress Notes (Signed)
07/28/2020: 4 PM to 5 PM.  Today's visit was a in person visit that was conducted in my outpatient clinic office with the patient myself present.  This is a follow-up visit to work on therapeutic interventions for coping with residual cognitive deficits and inability to work.  The patient is continued to have a lot of stressors in some of the people that have been in his support network have left and he is more challenged keeping up with day-to-day activities.  The patient has not been able to return to work and while he does retain some areas of efficient cognitive functioning his attention and concentration and memory loss and executive function issues continue to be very problematic.  The patient is applied for Social Security disability and has not been able to return to work.  His work would take him back if he could do the job but he knows with his ongoing cognitive difficulties and significant medical/metabolic issues that this is not a reasonable expectation.

## 2020-08-06 ENCOUNTER — Other Ambulatory Visit: Payer: Self-pay

## 2020-08-06 ENCOUNTER — Encounter: Payer: 59 | Attending: Psychology | Admitting: Psychology

## 2020-08-06 DIAGNOSIS — F908 Attention-deficit hyperactivity disorder, other type: Secondary | ICD-10-CM | POA: Diagnosis not present

## 2020-08-06 DIAGNOSIS — F0150 Vascular dementia without behavioral disturbance: Secondary | ICD-10-CM | POA: Diagnosis not present

## 2020-08-06 DIAGNOSIS — R413 Other amnesia: Secondary | ICD-10-CM

## 2020-08-06 DIAGNOSIS — R419 Unspecified symptoms and signs involving cognitive functions and awareness: Secondary | ICD-10-CM

## 2020-08-06 DIAGNOSIS — R269 Unspecified abnormalities of gait and mobility: Secondary | ICD-10-CM | POA: Diagnosis present

## 2020-08-06 DIAGNOSIS — F01A0 Vascular dementia, mild, without behavioral disturbance, psychotic disturbance, mood disturbance, and anxiety: Secondary | ICD-10-CM

## 2020-08-11 ENCOUNTER — Ambulatory Visit: Payer: 59 | Admitting: Psychology

## 2020-08-13 ENCOUNTER — Encounter: Payer: 59 | Admitting: Psychology

## 2020-08-13 ENCOUNTER — Other Ambulatory Visit: Payer: Self-pay

## 2020-08-13 DIAGNOSIS — F0150 Vascular dementia without behavioral disturbance: Secondary | ICD-10-CM | POA: Diagnosis not present

## 2020-08-13 DIAGNOSIS — F908 Attention-deficit hyperactivity disorder, other type: Secondary | ICD-10-CM | POA: Diagnosis not present

## 2020-08-13 DIAGNOSIS — F01A0 Vascular dementia, mild, without behavioral disturbance, psychotic disturbance, mood disturbance, and anxiety: Secondary | ICD-10-CM

## 2020-08-19 ENCOUNTER — Encounter: Payer: Self-pay | Admitting: Psychology

## 2020-08-19 ENCOUNTER — Other Ambulatory Visit: Payer: Self-pay

## 2020-08-19 ENCOUNTER — Encounter: Payer: 59 | Admitting: Psychology

## 2020-08-19 DIAGNOSIS — F908 Attention-deficit hyperactivity disorder, other type: Secondary | ICD-10-CM

## 2020-08-19 DIAGNOSIS — F0150 Vascular dementia without behavioral disturbance: Secondary | ICD-10-CM

## 2020-08-19 DIAGNOSIS — F01A0 Vascular dementia, mild, without behavioral disturbance, psychotic disturbance, mood disturbance, and anxiety: Secondary | ICD-10-CM

## 2020-08-19 NOTE — Progress Notes (Addendum)
The patient appeared highly anxious and stressed. 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 As this was our first visit (e.g., switched from Dr. Marvetta Gibbons care due to problems with scheduling and limited availability), I inquired about previous therapeutic interventions that he may be working on or that have worked in the past. His blood glucose meter sounded several times during our visit and he seemed to ignore it most times. He forgot to bring snacks he 'typically" has and denied eating anything that day. He reportedly drank coffee and took his medications; he did not drink water. He stated that he ordinarily eats healthy during the day but can go several hours at a times without, especially when stressed. Education was provided on the impact of stress and poor diet on health, cognition, and well being. I expressed concern that  taking Adderal and other medications on an empty stomach, with coffee might also be having a negative impact. He agreed to make certain changes in diet and behavior (e.g., eat breakfast, carry glucose tabs or something similar at all times, etc.). We discussed listing his weekly activities to help organize and plan his schedule. He responded positively to this idea and agreed to bring a list of recorded activities from the upcoming week.   Next scheduled appointment: 08/13/20 at 1:00PM

## 2020-08-24 ENCOUNTER — Encounter: Payer: Self-pay | Admitting: Psychology

## 2020-08-24 NOTE — Progress Notes (Addendum)
The patient appeared somewhat less anxious but continued to report multiple life and psychosocial stressors. He admitted that eating breakfast before taking his Adderall and drinking more water have helped alleviate some excess tension and irritability during the day. He endorsed eating breakfast and some lunch today and mentioned feeling a little better than week before. He brought a backpack filled with some sugary snacks to our appointment and ate one about half-way into the session after feeling a little weak; his blood glucose meter alarm did not go off during the appointment.   He continued to appear tearful and highly distressed when talking about the negative consequences that his cognitive weaknesses have had on occupational, financial, and social aspects of his life. We discussed ways to simplify tasks such as breaking complex activities into simple simple step-by-step tasks and making checklists. I encouraged him to keep steps written down in a notebook or journal of sorts and use it to check relevant steps off as they are completed to help him stay on task and increase probability for completion    He was not compliant with previous request that he log some activities from the week. He attempted to recall some things he did over the weekend and week but had some trouble and his responses were vague and unclear. I provided him with a printed activity schedule for the upcoming week that had plenty of space to record activities/events. I reminded him of the importance to comply with this task to assess time management and help organize and plan his schedule. He continued to responded positively to this idea and agreed to complete the assignment.   Next scheduled appointment: 08/19/20 at 8:00AM

## 2020-08-25 ENCOUNTER — Encounter: Payer: Self-pay | Admitting: Psychology

## 2020-08-25 ENCOUNTER — Ambulatory Visit: Payer: 59 | Admitting: Psychology

## 2020-08-25 NOTE — Progress Notes (Signed)
The patient described having a slightly better week. 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 He expressed feeling some relief as he spent most of the days before overwhelmed with stress and anxiety. He apologized for not bringing in the activity schedule assigned during the precious visit, but claimed to have recorded some of his activities/tasks; these were reviewed. Most of his time was reportedly spent preparing for the talk and trying to respond to past emails. He admitted that this impacted his diet and other eating and sleeping behaviors. He did bring backpack with snacks to current visit and his meter did not sound as in initial appointment. He is making progress with regards to this.   Despite forgetting to bring his activity sheet 2 x in 2 weeks, he affirmed commitment to this and hopefully will comply moving forward. This assignment will be modified if he continues to have problems recording how he currently spends his time. I encouraged him to think of a way(s) to simplify the task as previously discussed.   For his problems with inattention, organization, planning, and most executive functions I encouraged him to create a free online account to an online cognitive training website that has activities with potential therapeutic value; lists were compiled by therapists at the [facility], and are categorized by level of difficulty. He responded positively to this suggestion and agreed to sign up.   Next scheduled appointment: 08/26/20 at 9:00AM

## 2020-08-26 ENCOUNTER — Encounter: Payer: 59 | Admitting: Psychology

## 2020-08-26 ENCOUNTER — Other Ambulatory Visit: Payer: Self-pay

## 2020-08-26 ENCOUNTER — Encounter: Payer: Self-pay | Admitting: Psychology

## 2020-08-26 DIAGNOSIS — F01A0 Vascular dementia, mild, without behavioral disturbance, psychotic disturbance, mood disturbance, and anxiety: Secondary | ICD-10-CM

## 2020-08-26 DIAGNOSIS — F0150 Vascular dementia without behavioral disturbance: Secondary | ICD-10-CM | POA: Diagnosis not present

## 2020-08-26 DIAGNOSIS — F908 Attention-deficit hyperactivity disorder, other type: Secondary | ICD-10-CM | POA: Diagnosis not present

## 2020-08-26 NOTE — Progress Notes (Signed)
The patient reportedly woke up with low blood sugar (<90) and described feeling a little dizzy at the onset of the visit. He appeared somewhat unsteady on his feet but did not require assistance ambulating. He claimed to have eaten cereal for breakfast but denied drinking any water. He was initially resistant to drinking some water in session but eventually agreed; this helped reduce dizziness. He did not bring snacks or anything to increase blood sugar; this was addressed.   The patient completed his homework assignment from the previous week. We reviewed his activity log in detail. He apologized for doing a  "slack job" on the task but described it as helpful. He acknowledged having more awareness into various periods of inefficiency and poor time management. We discussed ways to improve efficiency and productivity during the week and made a list of his current duties/responsibilities and goals. 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 A time limit of 30 minutes per e-mail was set and we discussed various strategies to accomplish this goal. He reportedly spends too much time drafting emails and obsesses over word choice and sentence structure. I encouraged him to think of a way(s) to simplify the task to reinforce previous lessons. I then requested that he bring a de-idetified version of one of these emails for homework and to note the amount of time he spent completing the task. He also agreed to complete another weekly activity schedule.   He signed up to the online cognitive training website as previously suggested but denied spending much time engaged in any of the activities. We discussed possible benefits of scheduling time each day to increase potential benefits. He agreed, and reiterated desire to incorporate this into his routine.   He continues to report low self-esteem due to being unemployed. This will continue to be monitored and  a focus for intervention moving forward. He is scheduled for another 2 visits (one week apart). He is making gradual progress toward meeting goals and is an active participant in session. He appears highly motivated to work on diet, sleep, and time management/organizational skills via behavioral interventions and will likely benefit with a CBT approach to improve mood and self esteem; this all negatively impacts management of his diabetes and basic self-care.   Next scheduled appointment: 09/02/20 at 9:00AM

## 2020-09-02 ENCOUNTER — Encounter: Payer: 59 | Admitting: Psychology

## 2020-09-09 ENCOUNTER — Encounter: Payer: 59 | Attending: Psychology | Admitting: Psychology

## 2020-09-09 ENCOUNTER — Other Ambulatory Visit: Payer: Self-pay

## 2020-09-09 VITALS — BP 150/71 | HR 58

## 2020-09-09 DIAGNOSIS — F0150 Vascular dementia without behavioral disturbance: Secondary | ICD-10-CM | POA: Diagnosis present

## 2020-09-09 DIAGNOSIS — F908 Attention-deficit hyperactivity disorder, other type: Secondary | ICD-10-CM | POA: Insufficient documentation

## 2020-09-09 DIAGNOSIS — R419 Unspecified symptoms and signs involving cognitive functions and awareness: Secondary | ICD-10-CM | POA: Diagnosis present

## 2020-09-09 DIAGNOSIS — F01A0 Vascular dementia, mild, without behavioral disturbance, psychotic disturbance, mood disturbance, and anxiety: Secondary | ICD-10-CM

## 2020-09-10 ENCOUNTER — Encounter: Payer: Self-pay | Admitting: Psychology

## 2020-09-10 NOTE — Progress Notes (Signed)
Subjective:    Patient ID: Spencer Burke is a 59 y.o. male.  Chief Complaint: Inattention and poor time management, low self-esteem, depressed and anxious mood.   HPI: The patient had some memory issues that were directly related to attentional deficits identified and that evaluation performed years ago.  The patient has continued to be treated for adult residual attention deficit disorder and continues to take Adderall.  The patient was seen on August 14, 2019 with onset of gait instability.  The patient reports that he has identified a time at the end of September where he began having symptoms that have now been attributed to a stroke.  The patient reports that he called his doctor on October 6 after he started to collapse and fall and they ended up doing an MRI scan 2 months later and saw evidence of his stroke.  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  The patient has improved with physical therapy but continues to have episodes of gait instability.  The patient describes good days and bad days and difficulty on some days where he cannot walk and then others he may go on a 10 mile hike the next day.  The patient continues to describe issues with memory, attention and concentration deficits and weaknesses, word finding difficulties and balance issues.  The patient does have neuropathy and weakness. The patient describes most of his cognitive issues have to do with memory changes, concentration issues and word finding problems and reports that these have worsened since his stroke.The patient reports that he has fairly consistent sleep patterns traditionally and some snoring.  The patient reports that he is now started having some sleep issues.  Appetite is good.  The patient was hospitalized in November 2020 for 2 days because of strokelike event.  The patient has not worked since November 2020 because of his memory and attention issues, falling and imbalance issues and  weakness. The patient has been diagnosed with type 1 diabetes when he was 61[age]1/2 or 59 years old and is on an insulin pump.  The patient will have blood glucose levels reach 400 or above.  The patient reports that when it is below 90 he starts having trouble and his app for monitoring his blood glucose levels "goes off all the time.  The patient has been diagnosed with neuropathy.  "I feel overwhelmed". Patient requested his blood pressure to be checked upon entering the appointment due feeling "lightheaded and dizzy"   Objective:  Physical Exam Psychiatric:        Attention and Perception: He is inattentive.        Mood and Affect: Mood is anxious and depressed. Affect is tearful.        Speech: Speech is tangential.        Behavior: Behavior is agitated. Behavior is cooperative.        Thought Content: Thought content normal.        Cognition and Memory: Memory normal. Cognition is impaired.        Judgment: Judgment is impulsive.    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   He is making good progress toward meeting goals and is an active participant in session. He completed homework and gave good effort.  He continues to appear motivated to work on health behavioral interventions and has responded well to a CBT based approach.  Plan is to aid with proper management of his diabetes and encourage healthy behaviors to reduce other vascular risk factors.     Next scheduled  appointment: 09/23/20 at 4:00PM

## 2020-09-23 ENCOUNTER — Encounter (HOSPITAL_BASED_OUTPATIENT_CLINIC_OR_DEPARTMENT_OTHER): Payer: 59 | Admitting: Psychology

## 2020-09-23 ENCOUNTER — Other Ambulatory Visit: Payer: Self-pay

## 2020-09-23 DIAGNOSIS — F0150 Vascular dementia without behavioral disturbance: Secondary | ICD-10-CM | POA: Diagnosis not present

## 2020-09-23 DIAGNOSIS — G3184 Mild cognitive impairment, so stated: Secondary | ICD-10-CM | POA: Diagnosis not present

## 2020-09-23 DIAGNOSIS — F01A0 Vascular dementia, mild, without behavioral disturbance, psychotic disturbance, mood disturbance, and anxiety: Secondary | ICD-10-CM

## 2020-09-27 ENCOUNTER — Encounter: Payer: Self-pay | Admitting: Psychology

## 2020-09-27 NOTE — Progress Notes (Addendum)
Subjective:    Patient ID: Spencer Burke is a 59 y.o. male.  Chief Complaint: The patient had some memory issues that were directly related to attentional deficits identified and that evaluation performed years ago.  The patient has continued to be treated for adult residual attention deficit disorder and continues to take Adderall.  The patient was seen on August 14, 2019 with onset of gait instability.  The patient reports that he has identified a time at the end of September where he began having symptoms that have now been attributed to a stroke.  The patient reports that he called his doctor on October 6 after he started to collapse and fall and they ended up doing an MRI scan 2 months later and saw evidence of his stroke.  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  The patient has improved with physical therapy but continues to have episodes of gait instability.  The patient describes good days and bad days and difficulty on some days where he cannot walk and then others he may go on a 10 mile hike the next day.  The patient continues to describe issues with memory, attention and concentration deficits and weaknesses, word finding difficulties and balance issues.  The patient does have neuropathy and weakness. The patient describes most of his cognitive issues have to do with memory changes, concentration issues and word finding problems and reports that these have worsened since his stroke.The patient reports that he has fairly consistent sleep patterns traditionally and some snoring.  The patient reports that he is now started having some sleep issues.  Appetite is good.  The patient was hospitalized in November 2020 for 2 days because of strokelike event.  The patient has not worked since November 2020 because of his memory and attention issues, falling and imbalance issues and weakness.  The patient has been diagnosed with type 1 diabetes when he was 25-1/2 or 59 years old  and is on an insulin pump.  The patient will have blood glucose levels reach 400 or above.  The patient reports that when it is below 90 he starts having trouble and his app for monitoring his blood glucose levels "goes off all the time.  The patient has been diagnosed with neuropathy.  09/23/20:  Pt presents anxious but less depressed for his individual session. Patient discussed his psychiatric symptoms and current life events. Patient described having a more productive week and noted feeling better about himself. He admitted to sleeping in a chair most nights and having poor overall sleep quality.   Objective:  Physical Exam Psychiatric:        Attention and Perception: Perception normal. He is inattentive. He does not perceive auditory or visual hallucinations.        Mood and Affect: Mood is anxious and depressed. Mood is not elated. Affect is not labile, blunt, flat, angry, tearful or inappropriate.        Speech: He is communicative. Speech is tangential. Speech is not delayed or slurred.        Behavior: Behavior is agitated (Mild ) and hyperactive. Behavior is not slowed, aggressive, withdrawn or combative. Behavior is cooperative.        Thought Content: Thought content is not paranoid or delusional. Thought content does not include homicidal or suicidal ideation. Thought content does not include homicidal plan.        Judgment: Judgment is impulsive and inappropriate.   Goal: Enhance ability to effectively regulate mood  and improve time management.   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 Used Socratic questions. I provided coping strategies and skills in addressing these issues, providing pychoeducation and exploration of feelings. Education on sleep hygiene and stimulus control techniques were also discussed.   Response/Effectiveness: Good and appropriate. He  was compliant with time management worksheet assigned last visit and described it as "very helpful". He continues to respond well to this approach and is making good progress in prioritizing daily/weekly activities based on short and long term goals.   Plan:   I will continue to meet with patient to address treatment plan goals. Patient will continue to follow recommendations of providers and implement skills learned in session. Additional education on sleep hygiene and stimulus control techniques will be provided during next visit.    The patient was provided an opportunity to ask questions and all were answered. The patient agreed with the plan and demonstrated an understanding of the homework.   I spent 60 minutes face-to-face with patient during today's therapy appointment.

## 2020-09-30 ENCOUNTER — Other Ambulatory Visit: Payer: Self-pay

## 2020-09-30 ENCOUNTER — Encounter: Payer: 59 | Admitting: Psychology

## 2020-09-30 DIAGNOSIS — F0150 Vascular dementia without behavioral disturbance: Secondary | ICD-10-CM

## 2020-09-30 DIAGNOSIS — F908 Attention-deficit hyperactivity disorder, other type: Secondary | ICD-10-CM

## 2020-09-30 DIAGNOSIS — R419 Unspecified symptoms and signs involving cognitive functions and awareness: Secondary | ICD-10-CM

## 2020-09-30 DIAGNOSIS — F01A0 Vascular dementia, mild, without behavioral disturbance, psychotic disturbance, mood disturbance, and anxiety: Secondary | ICD-10-CM

## 2020-10-01 ENCOUNTER — Encounter: Payer: Self-pay | Admitting: Psychology

## 2020-10-01 NOTE — Progress Notes (Signed)
Subjective:    Patient ID: Spencer Burke is a 59 y.o. male.  Chief Complaint: Taken from initial visit with Dr. Kieth Brightly on 03/23/2020: The patient had some memory issues that were directly related to attentional deficits identified and that evaluation performed years ago. The patient has continued to be treated for adult residual attention deficit disorder and continues to take Adderall. The patient was seen on August 14, 2019 with onset of gait instability. The patient reports that he has identified a time at the end of September where he began having symptoms that have now been attributed to a stroke. The patient reports that he called his doctor on October 6 after he started to collapse and fall and they ended up doing an MRI scan 2 months later and saw evidence of his stroke. 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 The patient has improved with physical therapy but continues to have episodes of gait instability. The patient describes good days and bad days and difficulty on some days where he cannot walk and then others he may go on a 10 mile hike the next day. The patient continues to describe issues with memory, attention and concentration deficits and weaknesses, word finding difficulties and balance issues. The patient does have neuropathy and weakness. The patient describes most of his cognitive issues have to do with memory changes, concentration issues and word finding problems and reports that these have worsened since his stroke.The patient reports that he has fairly consistent sleep patterns traditionally and some snoring. The patient reports that he is now started having some sleep issues. Appetite is good. The patient was hospitalized in November 2020 for 2 days because of strokelike event. The patient has not worked since November 2020 because of his memory and attention issues, falling and imbalance issues and weakness. The patient has been  diagnosed with type 1 diabetes when he was 78-1/2 or 59 years old and is on an insulin pump. The patient will have blood glucose levels reach 400 or above. The patient reports that when it is below 90 he starts having trouble and his app for monitoring his blood glucose levels "goes off all the time. The patient has been diagnosed with neuropathy.   Objective:  Physical Exam Psychiatric:        Attention and Perception: He is inattentive. He does not perceive auditory or visual hallucinations.        Mood and Affect: Mood is anxious and depressed. Mood is not elated. Affect is tearful (at times). Affect is not labile, blunt, flat, angry or inappropriate.        Speech: He is communicative. Speech is tangential. Speech is not rapid and pressured, delayed or slurred.        Behavior: Behavior is agitated. Behavior is not slowed, aggressive, withdrawn, hyperactive or combative. Behavior is cooperative.        Thought Content: Thought content normal. Thought content is not paranoid or delusional. Thought content does not include homicidal or suicidal ideation. Thought content does not include homicidal or suicidal plan.        Judgment: Judgment is impulsive and inappropriate.    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    Effectiveness: Established problem, stable/improving (1), Review of last therapy session (1) and Review  of psycho-social stressors (1). Progressing It is felt more time is needed for Interventions to work.    I spent 60 minutes face-to-face with patient during today's therapy visit.

## 2020-10-07 ENCOUNTER — Encounter: Payer: 59 | Attending: Psychology | Admitting: Psychology

## 2020-10-07 ENCOUNTER — Other Ambulatory Visit: Payer: Self-pay

## 2020-10-07 DIAGNOSIS — E1051 Type 1 diabetes mellitus with diabetic peripheral angiopathy without gangrene: Secondary | ICD-10-CM | POA: Diagnosis present

## 2020-10-07 DIAGNOSIS — F0150 Vascular dementia without behavioral disturbance: Secondary | ICD-10-CM | POA: Insufficient documentation

## 2020-10-07 DIAGNOSIS — F411 Generalized anxiety disorder: Secondary | ICD-10-CM | POA: Diagnosis present

## 2020-10-07 DIAGNOSIS — F908 Attention-deficit hyperactivity disorder, other type: Secondary | ICD-10-CM | POA: Insufficient documentation

## 2020-10-07 DIAGNOSIS — R419 Unspecified symptoms and signs involving cognitive functions and awareness: Secondary | ICD-10-CM | POA: Insufficient documentation

## 2020-10-07 DIAGNOSIS — E1065 Type 1 diabetes mellitus with hyperglycemia: Secondary | ICD-10-CM | POA: Diagnosis present

## 2020-10-07 DIAGNOSIS — IMO0002 Reserved for concepts with insufficient information to code with codable children: Secondary | ICD-10-CM

## 2020-10-07 DIAGNOSIS — F01A0 Vascular dementia, mild, without behavioral disturbance, psychotic disturbance, mood disturbance, and anxiety: Secondary | ICD-10-CM

## 2020-10-13 ENCOUNTER — Encounter: Payer: Self-pay | Admitting: Psychology

## 2020-10-13 NOTE — Progress Notes (Signed)
Subjective:    Patient ID: Spencer Burke is a 59 y.o. male.   Chief Complaint:  Inattention and poor time management, low self-esteem, depressed and anxious mood.   HPI: Diagnosed with type 1 diabetes around 59-80 years old. Poorly managed. Currently on insulin pump. Neuropathy present. The patient has not worked since November 2020 because of his memory and attention issues, falling and imbalance issues and weakness.   He was seen by Dr. Sidney Ace (Endocrinology) at [facility] On 07/27/20 for follow up of diabetes. It was noted that his average glucose over 7 days was 189. Target gluose ranged 70-180 and 45 % of readings were within that range, with 55% above, include 14% above 250. Average glucose on awakening on his CGM at 8AM was around 120. It increased to around 200 at 11AM after breakfast, gradually increasing to 220 in middle afternoon, back down to 100 by 7 PM, increasing to 250 at 10PM, and then coming back down overnight. Dr. Sidney Ace also noted that:   "According to his pump, his insulin delivery suspended for about 46 minutes daily. He receives an average of 26.7 units of insulin daily, with 11.9 units of that, or 45 percent, coming through basal rates, and 14.7 units, or 55 percent, coming via boluses. He enters an average of 176 g of carbohydrates daily"   "I feel better about my financial situation after calling the tax attorney and getting help"  "I feel that my current living situations causes me more stress than is good for my health"   Objective:  Physical Exam Psychiatric:        Attention and Perception: Perception normal. He is inattentive. He does not perceive auditory or visual hallucinations.        Mood and Affect: Mood is anxious and depressed. Mood is not elated. Affect is tearful. Affect is not labile, blunt, flat, angry or inappropriate.        Speech: He is communicative. Speech is tangential (Moderate). Speech is not rapid and pressured, delayed or slurred.         Behavior: Behavior is agitated. Behavior is not slowed, aggressive, withdrawn, hyperactive or combative. Behavior is cooperative.        Thought Content: Thought content is not paranoid or delusional. Thought content does not include homicidal or suicidal ideation. Thought content does not include homicidal or suicidal plan.        Judgment: Judgment is impulsive and inappropriate.     Comments: Thought process: Disorganized and tangential     Assessment:   Diagnosis: Mild vascular neurocognitive disorder (HCC); Type 1 diabetes, uncontrolled, with peripheral neuropathy (HCC), Adult residual type attention deficit hyperactivity disorder (ADHD)  Intervention: Psychoeducation along with cognitive-behavioral and supportive therapy interventions to improve mood (e.g., cognitive reframing) build organizational and memory strategies, help manage type 1 diabetes, and incorporate health lifestyle changes to prevent another CVA.   Response/Effectiveness: He was compliant with last weeks homework assignment and reportedly felt good about himself after completing it. He is making gradual progress toward meeting goals and was an active participant in today's session.  It is felt more time is needed for Interventions to work.    Plan:   Continue to monitor mood and use behavioral interventions to improve diet and management of diabetes, other aspects of self care, sleep, and time management/organizational skills along with cognitive restructuring and other CBT-based techniques to improve mood and self esteem. A combination of motivational interviewing, problem-solving, and time management/organizational skills building is also  being used to address current financial situation and plan for future. Explore sources of social support outside immediate family.  I spent 60 minutes face-to-face with patient during today's therapy visit.   Next scheduled appointment: 10/19/20 at 10:00AM     ______________________________ Thayer Headings, PsyD Licensed Psychologist (Provisional) Neuropsychologist

## 2020-10-14 ENCOUNTER — Encounter: Payer: 59 | Admitting: Psychology

## 2020-10-19 ENCOUNTER — Encounter: Payer: 59 | Admitting: Psychology

## 2020-10-19 ENCOUNTER — Other Ambulatory Visit: Payer: Self-pay

## 2020-10-19 DIAGNOSIS — E1051 Type 1 diabetes mellitus with diabetic peripheral angiopathy without gangrene: Secondary | ICD-10-CM | POA: Diagnosis not present

## 2020-10-19 DIAGNOSIS — F0150 Vascular dementia without behavioral disturbance: Secondary | ICD-10-CM

## 2020-10-19 DIAGNOSIS — F908 Attention-deficit hyperactivity disorder, other type: Secondary | ICD-10-CM

## 2020-10-19 DIAGNOSIS — R419 Unspecified symptoms and signs involving cognitive functions and awareness: Secondary | ICD-10-CM | POA: Diagnosis not present

## 2020-10-19 DIAGNOSIS — F411 Generalized anxiety disorder: Secondary | ICD-10-CM

## 2020-10-19 DIAGNOSIS — IMO0002 Reserved for concepts with insufficient information to code with codable children: Secondary | ICD-10-CM

## 2020-10-19 DIAGNOSIS — E1065 Type 1 diabetes mellitus with hyperglycemia: Secondary | ICD-10-CM

## 2020-10-19 DIAGNOSIS — F01A0 Vascular dementia, mild, without behavioral disturbance, psychotic disturbance, mood disturbance, and anxiety: Secondary | ICD-10-CM

## 2020-10-26 ENCOUNTER — Other Ambulatory Visit: Payer: Self-pay

## 2020-10-26 ENCOUNTER — Encounter: Payer: 59 | Admitting: Psychology

## 2020-10-26 DIAGNOSIS — F411 Generalized anxiety disorder: Secondary | ICD-10-CM

## 2020-10-26 DIAGNOSIS — E1051 Type 1 diabetes mellitus with diabetic peripheral angiopathy without gangrene: Secondary | ICD-10-CM

## 2020-10-26 DIAGNOSIS — F0150 Vascular dementia without behavioral disturbance: Secondary | ICD-10-CM

## 2020-10-26 DIAGNOSIS — IMO0002 Reserved for concepts with insufficient information to code with codable children: Secondary | ICD-10-CM

## 2020-10-26 DIAGNOSIS — F01A0 Vascular dementia, mild, without behavioral disturbance, psychotic disturbance, mood disturbance, and anxiety: Secondary | ICD-10-CM

## 2020-10-26 DIAGNOSIS — F908 Attention-deficit hyperactivity disorder, other type: Secondary | ICD-10-CM | POA: Diagnosis not present

## 2020-10-26 DIAGNOSIS — E1065 Type 1 diabetes mellitus with hyperglycemia: Secondary | ICD-10-CM

## 2020-10-27 ENCOUNTER — Encounter: Payer: Self-pay | Admitting: Psychology

## 2020-10-27 NOTE — Progress Notes (Signed)
  THERAPY PROGRESS NOTE  Session Time: 60 minutes   Subjective:   Patient ID: Spencer Burke is a 59 y.o. male.  Chief Complaint: uncontrolled type 1 diabetes, cognitive complaints, problems with organization and time management, anxious and depressed mood.    Objective:  Physical Exam Psychiatric:        Attention and Perception: He is inattentive. He does not perceive auditory or visual hallucinations.        Mood and Affect: Mood is anxious and depressed. Mood is not elated. Affect is labile. Affect is not blunt, flat, angry, tearful or inappropriate.        Speech: He is communicative. Speech is tangential. Speech is not rapid and pressured, delayed or slurred.        Behavior: Behavior is agitated, withdrawn and combative. Behavior is not slowed, aggressive or hyperactive. Behavior is cooperative.        Thought Content: Thought content is not paranoid or delusional. Thought content does not include homicidal or suicidal ideation. Thought content does not include homicidal or suicidal plan.        Cognition and Memory: Memory normal. Memory is not impaired. He does not exhibit impaired recent memory or impaired remote memory.        Judgment: Judgment is impulsive and inappropriate.    Assessment:   Diagnosis: Mild vascular neurocognitive disorder (HCC)  Type 1 diabetes, uncontrolled, with neuropathy (HCC)  Adult residual type attention deficit hyperactivity disorder (ADHD)   Type of Therapy: Individual Therapy  Treatment Goals addressed: Organization and time management skills,  Anxiety and Coping  Interventions: CBT, Solution Focused, Strength-based and Psychosocial Skills: Organization and time management  Participation Level: Active  Effectiveness: Good and appropriate, slow gradual progress towards reaching goals.  Therapist Response:  Helped patient prioritize tasks/duties for the upcoming week and develop focus plan   Generalized anxiety and ADHD symptoms continue  to interfere with management of his medical conditions and impair psychosocial functioning. These areas are expected to gradually improve with continued treatment.   Plan:   Plan: Return again in 1 week.    [name] 10/19/2020

## 2020-11-03 ENCOUNTER — Encounter: Payer: Self-pay | Admitting: Psychology

## 2020-11-03 NOTE — Progress Notes (Addendum)
Subjective:    Patient ID: Spencer Burke is a 59 y.o. male.  Chief Complaint: Fairly controlled type 1 diabetes, cognitive complaints, problems with organization and time management, anxious and depressed mood.    HPI:  Spencer Burke is a 59y.o. male who returns for weekly (1x 60 min) psychotherapy to work on Doctor, general practice and lifestyle modifications to better manage type 1 diabetes (diagnosed at age 89), inattention and problems with time management and organization, as well as symptoms of generalized anxiety, all of which continue to interfere with his psychosocial functioning and treatment of his medical conditions.   Objective:   Current Signs/Symptoms Mood: Anxious and mildly depressed  Sleep: Poor Appetite: Poor Energy level: Variable but overall reduced  Suicidal ideas: Denied Homicidal ideas: Denied  Anxiety: Endorsed  Impulse Control: Reduced  Hallucinations: Denied, did not appear to be responding to internal stimuli  Delusions: None elicited Other: Disorganized thought process  Substance abuse: Denied   Current Outpatient Medications  Medication Sig Dispense Refill  . amphetamine-dextroamphetamine (ADDERALL) 20 MG tablet Take 20 mg by mouth 2 (two) times a day as needed (attention).  [name] atorvastatin (LIPITOR) 40 mg tablet Take one tablet (40 mg dose) by mouth at bedtime. 30 tablet 0  . glucagon 1 MG injection Follow package directions for low blood sugar. 1 kit 1  . insulin aspart (NOVOLOG) 100 Unit/mL injection USE THRU INSULIN PUMP--UP TO 65 UNITS A DAY--NOVOLOG 60 mL 1  . insulin glargine (LANTUS) 100 Unit/mL injection Inject 14 Units into the skin at bedtime as needed (in the event of insulin pump failure).  [name] lisinopril (PRINIVIL,ZESTRIL) 10 mg tablet TAKE 1/2 TABLET BY MOUTH EVERY DAY 45 tablet 1  . loratadine (CLARITIN) 10 MG tablet Take 10 mg by mouth daily as needed for Allergies.  . methocarbamol (ROBAXIN) 500 MG tablet Take one tablet (500 mg dose)  by mouth 3 (three) times a day as needed. 30 tablet 0  . naproxen (EC-NAPROSYN) 500 MG EC tablet Take one tablet (500 mg dose) by mouth 2 (two) times daily with meals. 60 tablet 2  . ONETOUCH ULTRA test strip USE AS DIRECTED TO CHECK BLOOD SUGAR 10-12 TIMES DAILY.CONTOUR NEXT 100 each 2  . pyridostigmine (MESTINON) 60 MG tablet 0.5 TO 1 TABLET 2 TIMES A DAY FOR 30 DAYS   Assessment:   Mild vascular neurocognitive disorder (HCC)  Type 1 diabetes, uncontrolled, with neuropathy (HCC)  Adult residual type attention deficit hyperactivity disorder (ADHD)  Generalized anxiety disorder   Type of Therapy: Individual Psychotherapy   Treatment Goals addressed: Organization and time management skills,  Anxiety and Coping, management of type 1 diabetes   Interventions: CBT, Solution Focused, Strength-based and Psychosocial Skills: Organization and time management  Participation Level: Active  Effectiveness: Good and appropriate, slow gradual progress towards reaching goals.  Therapist Response:  Helped patient prioritize tasks/duties for the upcoming week and develop focus plan   Generalized anxiety and ADHD symptomscontinue tointerfere with management of his medical conditions andimpairpsychosocial functioning. These areas are expected tograduallyimprove with prolonged treatment.    Plan:   Plan: Return again in 1 week to continue working on time management and organizational skill building, reducing hypervigilence and persistent (out of proportion) worry and over-concern regarding somatic complaints, and modifying dysfunctional beliefs.     Practice organization and time management skills daily. Participate in planned activities during the week. Use alarm/timer to help switch tasks. Record outcome, mood, and time after each scheduled task. Mindfulness and breathing  exercises at least 5-10 minutes per day.    I spent 60 minutes face-to-face with patient during today's therapy  session.    Alfonso Ellis 10/26/2020

## 2020-11-04 ENCOUNTER — Encounter: Payer: 59 | Attending: Psychology | Admitting: Psychology

## 2020-11-04 ENCOUNTER — Other Ambulatory Visit: Payer: Self-pay

## 2020-11-04 DIAGNOSIS — E1065 Type 1 diabetes mellitus with hyperglycemia: Secondary | ICD-10-CM | POA: Diagnosis present

## 2020-11-04 DIAGNOSIS — F0670 Mild neurocognitive disorder due to known physiological condition without behavioral disturbance: Secondary | ICD-10-CM

## 2020-11-04 DIAGNOSIS — F411 Generalized anxiety disorder: Secondary | ICD-10-CM | POA: Diagnosis present

## 2020-11-04 DIAGNOSIS — F0150 Vascular dementia without behavioral disturbance: Secondary | ICD-10-CM | POA: Diagnosis not present

## 2020-11-04 DIAGNOSIS — IMO0002 Reserved for concepts with insufficient information to code with codable children: Secondary | ICD-10-CM

## 2020-11-04 DIAGNOSIS — F01A0 Vascular dementia, mild, without behavioral disturbance, psychotic disturbance, mood disturbance, and anxiety: Secondary | ICD-10-CM

## 2020-11-04 DIAGNOSIS — R269 Unspecified abnormalities of gait and mobility: Secondary | ICD-10-CM

## 2020-11-04 DIAGNOSIS — F908 Attention-deficit hyperactivity disorder, other type: Secondary | ICD-10-CM | POA: Diagnosis present

## 2020-11-04 DIAGNOSIS — E1051 Type 1 diabetes mellitus with diabetic peripheral angiopathy without gangrene: Secondary | ICD-10-CM | POA: Diagnosis present

## 2020-11-12 ENCOUNTER — Encounter: Payer: Self-pay | Admitting: Psychology

## 2020-11-12 NOTE — Progress Notes (Addendum)
Therapy Progress Note    Subjective:    Patient ID: Spencer Burke is a 59 y.o. male.  Chief Complaint: Fairly controlled type 1 diabetes, cognitive complaints, problems with organization and time management, anxious and depressed mood.    HPI: Spencer Burke is a 59y.o. male who returns for weekly (1x 60 min) psychotherapy to develop/enhance coping skills and lifestyle modifications to better manage life-long type 1 diabetes, adult residual ADHD and problems with time management and disorganization, along with chronic worry that interferes with psychosocial functioning and medical treatment.    Objective:    Past Medical History:  Diagnosis Date  . ADD (attention deficit disorder)   . Diabetes [facility])     Patient Active Problem List   Diagnosis Date Noted  . Attention deficit disorder 09/08/2014  . Insulin dependent type 1 diabetes mellitus (HCC) 11/04/2010    Medications    Prior to Admission medications   Medication Sig Start Date End Date Taking? Authorizing Provider  amphetamine-dextroamphetamine (ADDERALL) 20 MG tablet Take 1 tablet (20 mg total) by mouth 2 (two) times daily. 11/24/19   York Spaniel, MD  aspirin 325 MG tablet Take 325 mg by mouth daily.    [provider]  atorvastatin (LIPITOR) 80 MG tablet Take 80 mg by mouth daily.    [provider]  glucose blood test strip CHECK BLOOD SUGAR 10-12 TIMES DAILY 04/05/15   [provider]  insulin aspart (NOVOLOG) 100 UNIT/ML injection Inject into the skin 3 (three) times daily before meals. Use as directed for insulin pump.    [provider]  Insulin Human (INSULIN PUMP) SOLN by Intravenous (Continuous Infusion) route. 5 units, Basal rate continous    [provider]  lisinopril (PRINIVIL,ZESTRIL) 10 MG tablet TAKE ONE TABLET BY MOUTH DAILY (pt stated he is tritrating back up ot 10mg , taking 5mg  now) 01/04/15   [provider]  loratadine (CLARITIN) 10 MG  tablet Take 5 mg by mouth as needed.    [provider]   Family History Family History  Problem Relation Age of Onset  . Transient ischemic attack Mother   . Transient ischemic attack Father    Social History Social History   Tobacco Use  . Smoking status: Never Smoker  . Smokeless tobacco: Never Used  Substance Use Topics  . Alcohol use: No  . Drug use: No   Physical Exam Psychiatric:        Attention and Perception: He is inattentive. He does not perceive auditory or visual hallucinations.        Mood and Affect: Mood is anxious and depressed. Mood is not elated. Affect is not labile, blunt, flat, angry, tearful or inappropriate.        Speech: He is communicative. Speech is tangential. Speech is not rapid and pressured, delayed or slurred.        Behavior: Behavior is agitated and withdrawn. Behavior is not aggressive, hyperactive or combative.        Thought Content: Thought content is paranoid (Mild ). Thought content is not delusional. Thought content does not include homicidal or suicidal ideation. Thought content does not include homicidal or suicidal plan.        Cognition and Memory: Memory normal. Cognition is impaired (variable attention ).        Judgment: Judgment is impulsive and inappropriate (Mildly reduced ).   I have reviewed other medical provider notes.   Assessment:   Mild vascular neurocognitive disorder (  HCC)  Type 1 diabetes, uncontrolled, with neuropathy (HCC)  Adult residual type attention deficit hyperactivity disorder (ADHD)  Generalized anxiety disorder   Type of Therapy: Individual Psychotherapy   Treatment Goals addressed: Organization and time management skills,  Anxiety and Coping, management of type 1 diabetes   Interventions: CBT, Solution Focused, Strength-based and Psychosocial Skills: Organization and time management  Participation Level: Active  Effectiveness: Good and appropriate, slow gradual progress towards  reaching goals. He completed homework from previous session and described noticeable improvement in his productivity and efficiency during the week. He also felt less anxious and more grounded practicing breathing/mindfulness exercises. He benefited greatly from a mindful eating exercise and was able to think about other ways he could incorporate this areas of his life he could apply this priciple to.    Therapist Response:  I led a mindful eating exercises that appeared to Helped patient prioritize tasks/duties for the upcoming week and develop "focus plan"   Generalized anxiety and ADHD symptomscontinue tointerfere with management of his medical conditions andimpairpsychosocial functioning. These areas are expected tograduallyimprove with prolonged treatment.    Homework: Actor and time management skills regularly during the day. We have scheduled activities for him to participate in with alarm/timer to help switch. Record outcome, mood, and time after each scheduled task. Mindfulness and breathing exercises as scheduled .   Plan:   Plan: Return on 11/15/20 to continue working on time management and organizational skills , reducing hypervigilence and chronic worrying regarding somatic complaints, modifying dysfunctional beliefs about controlling his chronic medical condition and its relation to sustained attention and other cognitive abilities, and improve self-esteem.   Next IND Therapy Session: 11/15/20    I spent 60 minutes face-to-face with patient during today's therapy session.     Spencer Burke 11/04/2020

## 2020-11-15 ENCOUNTER — Encounter: Payer: 59 | Admitting: Psychology

## 2020-11-19 ENCOUNTER — Other Ambulatory Visit: Payer: Self-pay

## 2020-11-19 ENCOUNTER — Ambulatory Visit
Admission: EM | Admit: 2020-11-19 | Discharge: 2020-11-19 | Disposition: A | Discharge status: Home / Self Care | Payer: 59 | Attending: Urgent Care | Admitting: Urgent Care

## 2020-11-19 ENCOUNTER — Ambulatory Visit (INDEPENDENT_AMBULATORY_CARE_PROVIDER_SITE_OTHER): Payer: 59

## 2020-11-19 DIAGNOSIS — S20212A Contusion of left front wall of thorax, initial encounter: Secondary | ICD-10-CM

## 2020-11-19 DIAGNOSIS — R1032 Left lower quadrant pain: Secondary | ICD-10-CM

## 2020-11-19 DIAGNOSIS — R0781 Pleurodynia: Secondary | ICD-10-CM

## 2020-11-19 MED ORDER — TIZANIDINE HCL 4 MG PO TABS: 4.0000 mg | ORAL_TABLET | Freq: Four times a day (QID) | ORAL | 0 refills | Status: DC | PRN

## 2020-11-19 MED ORDER — NAPROXEN 500 MG PO TABS: 500.0000 mg | ORAL_TABLET | Freq: Two times a day (BID) | ORAL | 0 refills | Status: DC

## 2020-11-19 NOTE — Discharge Instructions (Signed)
Do not use any nonsteroidal anti-inflammatories (NSAIDs) like ibuprofen, Motrin, naproxen, Aleve, etc. which are all available over-the-counter.  Please just use Tylenol at a dose of 500mg-650mg once every 6 hours as needed for your aches, pains, fevers. 
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
## 2020-11-24 ENCOUNTER — Other Ambulatory Visit: Payer: Self-pay

## 2020-11-24 ENCOUNTER — Encounter: Payer: 59 | Admitting: Psychology

## 2020-11-24 DIAGNOSIS — F0150 Vascular dementia without behavioral disturbance: Secondary | ICD-10-CM

## 2020-11-24 DIAGNOSIS — F411 Generalized anxiety disorder: Secondary | ICD-10-CM | POA: Diagnosis not present

## 2020-11-24 DIAGNOSIS — F908 Attention-deficit hyperactivity disorder, other type: Secondary | ICD-10-CM

## 2020-11-24 DIAGNOSIS — E1051 Type 1 diabetes mellitus with diabetic peripheral angiopathy without gangrene: Secondary | ICD-10-CM

## 2020-11-24 DIAGNOSIS — F01A0 Vascular dementia, mild, without behavioral disturbance, psychotic disturbance, mood disturbance, and anxiety: Secondary | ICD-10-CM

## 2020-11-24 DIAGNOSIS — E1065 Type 1 diabetes mellitus with hyperglycemia: Secondary | ICD-10-CM

## 2020-11-24 DIAGNOSIS — IMO0002 Reserved for concepts with insufficient information to code with codable children: Secondary | ICD-10-CM

## 2020-11-24 NOTE — Progress Notes (Addendum)
   Therapy Progress Note   Time spent with patient: 1 hour  Subjective:    Patient ID: Spencer Burke is a 59 y.o. male.  Chief Complaint: Poorly-fairly controlled type 1 diabetes, excessive worrying, poor time management, disorganization, procrastination, inattention and difficulty concentrating, rumination and obsessive thoughts     HPI: See previous neuropsychological evaluation dated in EMR 04/12/20  Objective:  Physical Exam Psychiatric:        Attention and Perception: He is inattentive. He perceives auditory hallucinations. He does not perceive visual hallucinations.        Mood and Affect: Mood is anxious and depressed. Mood is not elated. Affect is not labile, blunt, flat, angry, tearful or inappropriate.        Speech: He is communicative. Speech is slurred and tangential. Speech is not rapid and pressured or delayed.        Behavior: Behavior is agitated. Behavior is not slowed, aggressive, withdrawn, hyperactive or combative.        Thought Content: Thought content is paranoid (Mild). Thought content is not delusional. Thought content does not include homicidal or suicidal ideation. Thought content does not include homicidal or suicidal plan.        Cognition and Memory: Cognition is not impaired. Memory is not impaired. He does not exhibit impaired recent memory or impaired remote memory.        Judgment: Judgment is impulsive and inappropriate.     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    Homework: Compliant  with previous assignment. Instructed to complete "worry log" during upcoming week.   Plan:   Continue teaching and building psychosocial skills to improve organization and time management, reduce procrastination, CBT for generalized anxiety and pervasive worry, and healthy lifestyle habits for managing chronic medical condition (I.e., type 1 diabetes). We have 5 more weekly IND therapy sessions scheduled to continue working on goals.   Next IND therapy session: 12/08/20

## 2020-12-02 ENCOUNTER — Other Ambulatory Visit: Payer: Self-pay

## 2020-12-02 ENCOUNTER — Encounter: Payer: 59 | Admitting: Psychology

## 2020-12-02 ENCOUNTER — Encounter: Payer: Self-pay | Admitting: Psychology

## 2020-12-02 DIAGNOSIS — F01A0 Vascular dementia, mild, without behavioral disturbance, psychotic disturbance, mood disturbance, and anxiety: Secondary | ICD-10-CM

## 2020-12-02 DIAGNOSIS — IMO0002 Reserved for concepts with insufficient information to code with codable children: Secondary | ICD-10-CM

## 2020-12-02 DIAGNOSIS — F411 Generalized anxiety disorder: Secondary | ICD-10-CM | POA: Diagnosis not present

## 2020-12-02 DIAGNOSIS — F908 Attention-deficit hyperactivity disorder, other type: Secondary | ICD-10-CM

## 2020-12-02 DIAGNOSIS — E1065 Type 1 diabetes mellitus with hyperglycemia: Secondary | ICD-10-CM

## 2020-12-02 DIAGNOSIS — F0150 Vascular dementia without behavioral disturbance: Secondary | ICD-10-CM

## 2020-12-02 DIAGNOSIS — E1051 Type 1 diabetes mellitus with diabetic peripheral angiopathy without gangrene: Secondary | ICD-10-CM | POA: Diagnosis not present

## 2020-12-07 ENCOUNTER — Encounter: Payer: Self-pay | Admitting: Psychology

## 2020-12-07 NOTE — Progress Notes (Signed)
   Therapy Progress Note  Date of Service: 12/02/20  Patient ID: Spencer Burke is a 60 y.o. male.  DOB: Jul 28, 1961 MRN: [id number] Time spent with patient: 1 hour  Subjective:   Chief Complaint: Poorly-fairly controlled type 1 diabetes, excessive worrying, poor time management, disorganization, procrastination, inattention and difficulty concentrating, rumination and obsessive thoughts     HPI: See previous neuropsychological evaluation dated in EMR 04/12/20  Patient reported that his mother passed away 2 nights ago. He has been grieving with family and described doing "ok" under the circumstance   Objective:  Physical Exam Psychiatric:        Attention and Perception: He is inattentive. He does not perceive auditory or visual hallucinations.        Mood and Affect: Mood is anxious and depressed. Mood is not elated. Affect is not labile, blunt, flat, angry, tearful or inappropriate.        Speech: He is communicative. Speech is tangential. Speech is not rapid and pressured, delayed or slurred.        Behavior: Behavior is agitated and withdrawn. Behavior is not slowed, aggressive, hyperactive or combative. Behavior is cooperative.        Thought Content: Thought content is not delusional. Paranoid: mild. Thought content does not include homicidal or suicidal ideation. Thought content does not include homicidal or suicidal plan.        Cognition and Memory: Cognition is impaired (Attention and aspects of executive function). Memory is not impaired. He exhibits impaired recent memory. He does not exhibit impaired remote memory.        Judgment: Judgment is impulsive and inappropriate (mild).    Assessment:   Mild vascular neurocognitive disorder (HCC)  Type 1 diabetes, fairly controlled with neuropathy (HCC)  Adult residual type attention deficit hyperactivity disorder (ADHD)  Generalized anxiety disorder   Intervention: Supportive counseling; Emotional processing due to loss of  parent  Participation: Active, Alert   Response/Effectiveness: Good and appropriate; Patient described the current session as very beneficial.   Homework: None assigned as he is grieving passing of his mother.   Plan:   We have 4 additional weekly therapy appointments scheduled to continue CBT for GAD, psychosocial skills for Adult residual ADHD (e.g., organization, time management, reduce procrastination, etc.) and improved diet and exercise to reduce cardiovascular risk factors and more optimal management of his chronic medical conditions (I.e., type 1 diabetes).    Next IND therapy session scheduled for 12/08/20.

## 2020-12-08 ENCOUNTER — Encounter: Payer: 59 | Attending: Psychology | Admitting: Psychology

## 2020-12-08 ENCOUNTER — Other Ambulatory Visit: Payer: Self-pay

## 2020-12-08 DIAGNOSIS — F908 Attention-deficit hyperactivity disorder, other type: Secondary | ICD-10-CM | POA: Insufficient documentation

## 2020-12-08 DIAGNOSIS — E1065 Type 1 diabetes mellitus with hyperglycemia: Secondary | ICD-10-CM

## 2020-12-08 DIAGNOSIS — R269 Unspecified abnormalities of gait and mobility: Secondary | ICD-10-CM | POA: Diagnosis present

## 2020-12-08 DIAGNOSIS — F0150 Vascular dementia without behavioral disturbance: Secondary | ICD-10-CM | POA: Diagnosis present

## 2020-12-08 DIAGNOSIS — F01A0 Vascular dementia, mild, without behavioral disturbance, psychotic disturbance, mood disturbance, and anxiety: Secondary | ICD-10-CM

## 2020-12-08 DIAGNOSIS — F411 Generalized anxiety disorder: Secondary | ICD-10-CM | POA: Diagnosis present

## 2020-12-08 DIAGNOSIS — E1051 Type 1 diabetes mellitus with diabetic peripheral angiopathy without gangrene: Secondary | ICD-10-CM | POA: Diagnosis present

## 2020-12-08 DIAGNOSIS — IMO0002 Reserved for concepts with insufficient information to code with codable children: Secondary | ICD-10-CM

## 2020-12-12 ENCOUNTER — Encounter: Payer: Self-pay | Admitting: Psychology

## 2020-12-12 NOTE — Progress Notes (Signed)
   Therapy Progress Note  Date of Service: 12/08/2020 Patient: Spencer Burke  DOB: 1961-03-11 MRN: [id number] Time spent with patient: 1 hour  Subjective:   Chief Complaint: Poorly-fairly controlled type 1 diabetes, excessive worrying, poor time management, disorganization, procrastination, inattention and difficulty concentrating, rumination and obsessive thinking, low self-esteem   HPI: See previous neuropsychological evaluation dated in EMR 04/12/20  Patient is grieving recent loss of his mother. He has been spending time with immediate family and helping father as needed around the house. He feels more lonely without his mom around as he was closer to her.  He has been spending a lot of time ruminating and worrying about his status with a woman he has been pursuing for romantic relationship. He finds himself obsessessing over text messages and feeling insecure when she does not initiate conversations; this leads to increased negative self-talk and distorted thinking.   Objective:  Physical Exam Psychiatric:        Attention and Perception: He is inattentive. He does not perceive auditory or visual hallucinations.        Mood and Affect: Mood is anxious and depressed. Mood is not elated. Affect is not labile, blunt, flat, angry, tearful or inappropriate.        Speech: He is communicative. Speech is tangential. Speech is not rapid and pressured, delayed or slurred.        Behavior: Behavior is agitated and withdrawn. Behavior is not slowed, aggressive, hyperactive or combative. Behavior is cooperative.        Thought Content: Thought content is paranoid (mild). Thought content is not delusional. Thought content does not include homicidal or suicidal ideation. Thought content does not include homicidal or suicidal plan.        Cognition and Memory: Cognition is impaired (Attention and aspects of executive function). Memory is not impaired. He exhibits impaired recent memory. He does not  exhibit impaired remote memory.        Judgment: Judgment is impulsive and inappropriate (mild).    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    Therapist Response: Openly discussed, explored, and processed grief reaction and related emotions. Provided coping strategies and skills in addressing relational issues, and used cognitive restructuring to increase awareness and modify negative thinking patterns.   Plan:   We have 3 (1xweek) therapy appointments scheduled to continue CBT for GAD, psychosocial skills for Adult residual ADHD (e.g., organization, time management, reduce procrastination, etc.) and healthy lifestyle behaviors to reduce cardiovascular risk factors and more optimal management of his chronic medical conditions (I.e., type 1 diabetes). Grief counseling will also be utilized due to recent loss of his mother.   Next IND therapy session scheduled for 12/15/2020.

## 2020-12-15 ENCOUNTER — Other Ambulatory Visit: Payer: Self-pay

## 2020-12-15 ENCOUNTER — Encounter: Payer: 59 | Admitting: Psychology

## 2020-12-15 DIAGNOSIS — F908 Attention-deficit hyperactivity disorder, other type: Secondary | ICD-10-CM | POA: Diagnosis not present

## 2020-12-15 DIAGNOSIS — E1051 Type 1 diabetes mellitus with diabetic peripheral angiopathy without gangrene: Secondary | ICD-10-CM

## 2020-12-15 DIAGNOSIS — E1065 Type 1 diabetes mellitus with hyperglycemia: Secondary | ICD-10-CM

## 2020-12-15 DIAGNOSIS — F411 Generalized anxiety disorder: Secondary | ICD-10-CM

## 2020-12-15 DIAGNOSIS — F0150 Vascular dementia without behavioral disturbance: Secondary | ICD-10-CM | POA: Diagnosis not present

## 2020-12-15 DIAGNOSIS — F01A0 Vascular dementia, mild, without behavioral disturbance, psychotic disturbance, mood disturbance, and anxiety: Secondary | ICD-10-CM

## 2020-12-15 DIAGNOSIS — IMO0002 Reserved for concepts with insufficient information to code with codable children: Secondary | ICD-10-CM

## 2020-12-20 ENCOUNTER — Encounter: Payer: Self-pay | Admitting: Psychology

## 2020-12-20 NOTE — Progress Notes (Signed)
  THERAPIST PROGRESS NOTE  Session Time: 60 min  Participation Level: Active  Behavioral Response: Casual, Fairly Groomed and GuardedAlertAnxious, Depressed and Dysphoric   Objective:  Psychiatric:        Attention and Perception: He is inattentive. He does not perceive auditory or visual hallucinations.        Mood and Affect: Mood is anxious and depressed. Mood is not elated. Affect is tearful. Affect is not labile, blunt, flat, angry or inappropriate.        Speech: He is communicative. Speech is tangential. Speech is not rapid and pressured, delayed or slurred.        Behavior: Behavior is agitated, aggressive, withdrawn and combative. Behavior is not slowed or hyperactive. Behavior is cooperative.        Thought Content: Thought content is delusional. Thought content is not paranoid. Thought content does not include homicidal or suicidal ideation. Thought content does not include homicidal or suicidal plan.        Cognition and Memory: Memory normal. Cognition is impaired (Mild). Memory is not impaired. He does not exhibit impaired recent memory or impaired remote memory.        Judgment: Judgment is impulsive and inappropriate.   Type of Therapy: Individual Therapy  Treatment Goals addressed: Anxiety and Coping with loss of parent  Interventions: CBT, Psychosocial Skills: time management and planning/organization  and Supportive  Summary: Spencer Burke is a 60 y.o. male who presents with chronic medical challenges (e.g., neuropathy, midbrain stroke ) associated with type 1 diabetes (e.g., dx around 39-78 years old; managed with insulin pump) and persistent/pervasive worrying that causes distress and impairs functioning in multiple areas of his life (I.e., socially, occupationally, etc.). He has a longstanding history of ADHD accompanied by problems with attention and focus, time management, procrastination, impulsivity, emotion regulation, and organization/planning, which complicate  aspects of managing his health and emotional well being.   He continues to have episodes of gait instability; good and bad days. He described difficulty walking even a short distance at times and other occasions where he can hike 10 miles. He continues to describe issues with memory, attention and concentration deficits and weaknesses, word finding difficulties and balance issues. He has trouble falling and staying asleep.   Suicidal/Homicidal: Nowithout intent/plan   Therapist Response:  Provided supportive and grief focused counseling for his recent loss (i.e., mother).    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

## 2020-12-22 ENCOUNTER — Other Ambulatory Visit: Payer: Self-pay

## 2020-12-22 ENCOUNTER — Encounter: Payer: 59 | Admitting: Psychology

## 2020-12-22 DIAGNOSIS — IMO0002 Reserved for concepts with insufficient information to code with codable children: Secondary | ICD-10-CM

## 2020-12-22 DIAGNOSIS — F411 Generalized anxiety disorder: Secondary | ICD-10-CM | POA: Diagnosis not present

## 2020-12-22 DIAGNOSIS — F0150 Vascular dementia without behavioral disturbance: Secondary | ICD-10-CM | POA: Diagnosis not present

## 2020-12-22 DIAGNOSIS — F908 Attention-deficit hyperactivity disorder, other type: Secondary | ICD-10-CM

## 2020-12-22 DIAGNOSIS — E1065 Type 1 diabetes mellitus with hyperglycemia: Secondary | ICD-10-CM

## 2020-12-22 DIAGNOSIS — E1051 Type 1 diabetes mellitus with diabetic peripheral angiopathy without gangrene: Secondary | ICD-10-CM | POA: Diagnosis not present

## 2020-12-22 DIAGNOSIS — F01A0 Vascular dementia, mild, without behavioral disturbance, psychotic disturbance, mood disturbance, and anxiety: Secondary | ICD-10-CM

## 2020-12-22 DIAGNOSIS — R269 Unspecified abnormalities of gait and mobility: Secondary | ICD-10-CM

## 2020-12-22 NOTE — Progress Notes (Signed)
   THERAPIST PROGRESS NOTE  Session Time: 60 min  Participation Level: Active  Behavioral Response: Casual and Well GroomedAlertAnxious, Depressed and Dysphoric  Type of Therapy: Individual Therapy  Treatment Goals addressed: Coping with uncontrollable worry, future orientation, negative cognitive biases, somatic arousal, and interpersonal aversiveness  Interventions: CBT and Supportive grief counseling, psychosocial skills: time management and organization/planning, problem solving   Effectiveness: Fair; slowly progressing towards meeting goals.     Summary: Spencer Burke is a 60 y.o. male who presents to weekly psychotherapy appointment to develop/refine organization and time management skills, reduce procrastination, acquire healthy lifestyle habits for managing chronic medical conditions (I.e., type 1 diabetes) and reducing risk for another CVA, coping after with depression and generalized anxiety.   He has a longstanding history of ADHD accompanied by problems with attention and focus, time management, procrastination, impulsivity, emotion regulation, and organization/planning, which complicate aspects of managing his health and emotional well being.   Medium-High fall risk due to unstable gait and problems with balance and coordination; this waxes and wanes. He described difficulty walking short distance at times and other occasions where he can hike 10 miles. He continues to describe issues with memory, increase confusion and disorientation, attention and concentration deficits and weaknesses, as well as word finding difficulties. He continues to have trouble falling and staying asleep; this exacerbates fatigue during day and contributes to task inefficiency, thought to a less degree than ADHD, GAD, and complications from diabetes and other medical conditions that have contributed to vascular changes and mild neurocognitive impairment.   Suicidal/Homicidal: Nowithout  intent/plan  Therapist Response: Used active listening, reflection, and supportive statements to allow patient to feel heard and understood. Reminded him to treat thoughts as "hypotheses" about world. Reviewed common cognitive distortions and "thinking errors". Explored common situations that trigger his anxiety. Practiced "catching" irrational thoughts, and replacing them with rational alternatives. Highlighted difference between thinking about what "will happen" and "what could" happen and discussed relevance/application towards goal to reduce time spent worrying.  Practiced 10 minutes of relaxation.   Plan: Return again on 12/29/20 to continue individual psychotherapy. We have 4 more IND therapy sessions scheduled (1 x week for 60 min.) to continue working on goals outlined in treatment plan.   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

## 2020-12-27 ENCOUNTER — Encounter: Payer: Self-pay | Admitting: Psychology

## 2020-12-29 ENCOUNTER — Encounter: Payer: Self-pay | Admitting: Psychology

## 2020-12-29 ENCOUNTER — Other Ambulatory Visit: Payer: Self-pay

## 2020-12-29 ENCOUNTER — Encounter: Payer: 59 | Admitting: Psychology

## 2020-12-29 DIAGNOSIS — F0150 Vascular dementia without behavioral disturbance: Secondary | ICD-10-CM

## 2020-12-29 DIAGNOSIS — F908 Attention-deficit hyperactivity disorder, other type: Secondary | ICD-10-CM

## 2020-12-29 DIAGNOSIS — F411 Generalized anxiety disorder: Secondary | ICD-10-CM

## 2020-12-29 DIAGNOSIS — E1051 Type 1 diabetes mellitus with diabetic peripheral angiopathy without gangrene: Secondary | ICD-10-CM

## 2020-12-29 DIAGNOSIS — F01A0 Vascular dementia, mild, without behavioral disturbance, psychotic disturbance, mood disturbance, and anxiety: Secondary | ICD-10-CM

## 2020-12-29 DIAGNOSIS — IMO0002 Reserved for concepts with insufficient information to code with codable children: Secondary | ICD-10-CM

## 2020-12-29 DIAGNOSIS — R269 Unspecified abnormalities of gait and mobility: Secondary | ICD-10-CM

## 2020-12-29 DIAGNOSIS — E1065 Type 1 diabetes mellitus with hyperglycemia: Secondary | ICD-10-CM

## 2020-12-31 NOTE — Progress Notes (Signed)
THERAPIST PROGRESS NOTE  Subjective:    Patient ID: Spencer Burke is a 60 y.o. male.  Chief Complaint: Difficulty with attention and concentration, poor time management and procrastination, disorganization persistent worry and catastrophic thinking, intolerance of uncertainty, rumination, difficulty balance and coordination   Spencer Burke is a 60 y.o. male who presents to weekly psychotherapy appointment to develop/refine organization and time management skills, reduce procrastination, acquire healthy lifestyle habits for managing chronic medical conditions (I.e., type 1 diabetes) and reducing risk for another CVA, coping after with depression and generalized anxiety.   He has a longstanding history of ADHD accompanied by problems with attention and focus, time management, procrastination, impulsivity, emotion regulation, and organization/planning, which complicate aspects of managing his health and emotional well being.   Medium-High fall risk due to unstable gait and problems with balance and coordination; this waxes and wanes. He describeddifficultywalking short distance at times andother occasions wherehe can hike . Hecontinues to describe issues with memory, increase confusion and disorientation, attention and concentration deficits and weaknesses, as well as word finding difficulties.He continues to have trouble falling and staying asleep; this exacerbates fatigue during day and contributes to task inefficiency, thought to a less degree than ADHD, GAD, and complications from diabetes and other medical conditions that have contributed to vascular changes and mild neurocognitive impairment.   The following portions of the patient's history were reviewed and updated as appropriate: allergies, current medications, past family history, past medical history, past social history, past surgical history and problem list. Review of Systems  Objective:  Physical Exam Psychiatric:         Attention and Perception: He is inattentive. He does not perceive auditory or visual hallucinations.        Mood and Affect: Mood is anxious and depressed. Mood is not elated. Affect is not labile, blunt, flat, angry, tearful or inappropriate.        Speech: He is communicative. Speech is tangential. Speech is not rapid and pressured, delayed or slurred.        Behavior: Behavior is agitated (at times), slowed and withdrawn (less than previous weeks). Behavior is not aggressive, hyperactive or combative.        Thought Content: Thought content is paranoid. Thought content is not delusional. Thought content does not include homicidal or suicidal ideation. Thought content does not include homicidal or suicidal plan.        Cognition and Memory: Cognition is not impaired (inattention and difficulty with aspects of executive functioning). Memory is not impaired. He does not exhibit impaired recent memory or impaired remote memory.        Judgment: Judgment is impulsive.    Assessment:   Mild vascular neurocognitive disorder (HCC)  Type 1 diabetes, fairly controlled with neuropathy (HCC)  Adult residual type attention deficit hyperactivity disorder (ADHD)  Generalized anxiety disorder  Abnormality of gait   Intervention(s): CBT (i.e., cognitive restructuring) psychosocial skills: time management and organization/planning; problem solving   Treatment Goals addressed: Coping with uncontrollable worry and tolerating uncertainty, Challenging anxious thoughts and negative cognitive biases, mindfulness to target future orientation, relaxation to dampen somatic arousal  Participation: Alert and Active   Response/Effectiveness: Good and appropriate   Therapist Response: Reviewed homework and printed handouts from last appointment. Continued to identify internal and external triggers for his anxiety. Used socratic questioning and cognitive restructuring to modify negative automatic thoughts in  favor of adaptive alternatives. Practiced 3 minutes of recording thoughts and then around 7 minutes engaged in  a thought diffusion exercise. Practiced mindful breathing and progressive muscle relaxation for 10-15 minutes.   Plan:   Patient is scheduled for a follow up neuropsychological consultation appointment on 01/18/21 to determine need for repeat assessment. He requested that we schedule additional psychotherapy appointments after neuropsych follow up.   I spent 60 minutes face-to-face with patient during today's therapy appointment.

## 2021-01-18 ENCOUNTER — Encounter (HOSPITAL_COMMUNITY): Payer: Self-pay

## 2021-01-18 ENCOUNTER — Emergency Department (HOSPITAL_COMMUNITY)
Admission: EM | Admit: 2021-01-18 | Discharge: 2021-01-19 | Disposition: A | Discharge status: Home / Self Care | Payer: 59 | Attending: Emergency Medicine | Admitting: Emergency Medicine

## 2021-01-18 ENCOUNTER — Encounter: Payer: Self-pay | Admitting: Psychology

## 2021-01-18 ENCOUNTER — Other Ambulatory Visit: Payer: Self-pay

## 2021-01-18 ENCOUNTER — Encounter: Payer: 59 | Attending: Psychology | Admitting: Psychology

## 2021-01-18 DIAGNOSIS — E1051 Type 1 diabetes mellitus with diabetic peripheral angiopathy without gangrene: Secondary | ICD-10-CM | POA: Diagnosis present

## 2021-01-18 DIAGNOSIS — R413 Other amnesia: Secondary | ICD-10-CM | POA: Diagnosis present

## 2021-01-18 DIAGNOSIS — F411 Generalized anxiety disorder: Secondary | ICD-10-CM | POA: Diagnosis present

## 2021-01-18 DIAGNOSIS — F908 Attention-deficit hyperactivity disorder, other type: Secondary | ICD-10-CM | POA: Diagnosis present

## 2021-01-18 DIAGNOSIS — I999 Unspecified disorder of circulatory system: Secondary | ICD-10-CM

## 2021-01-18 DIAGNOSIS — E109 Type 1 diabetes mellitus without complications: Secondary | ICD-10-CM | POA: Insufficient documentation

## 2021-01-18 DIAGNOSIS — R419 Unspecified symptoms and signs involving cognitive functions and awareness: Secondary | ICD-10-CM | POA: Diagnosis present

## 2021-01-18 DIAGNOSIS — F0150 Vascular dementia without behavioral disturbance: Secondary | ICD-10-CM | POA: Diagnosis present

## 2021-01-18 DIAGNOSIS — E1065 Type 1 diabetes mellitus with hyperglycemia: Secondary | ICD-10-CM | POA: Insufficient documentation

## 2021-01-18 DIAGNOSIS — R110 Nausea: Secondary | ICD-10-CM | POA: Insufficient documentation

## 2021-01-18 DIAGNOSIS — Z794 Long term (current) use of insulin: Secondary | ICD-10-CM | POA: Diagnosis not present

## 2021-01-18 DIAGNOSIS — R269 Unspecified abnormalities of gait and mobility: Secondary | ICD-10-CM | POA: Diagnosis present

## 2021-01-18 DIAGNOSIS — R197 Diarrhea, unspecified: Secondary | ICD-10-CM

## 2021-01-18 DIAGNOSIS — F01A0 Vascular dementia, mild, without behavioral disturbance, psychotic disturbance, mood disturbance, and anxiety: Secondary | ICD-10-CM

## 2021-01-18 DIAGNOSIS — IMO0002 Reserved for concepts with insufficient information to code with codable children: Secondary | ICD-10-CM

## 2021-01-18 LAB — CBC
HCT: 37.4 % — ABNORMAL LOW (ref 39.0–52.0)
Hemoglobin: 12.2 g/dL — ABNORMAL LOW (ref 13.0–17.0)
MCH: 30.5 pg (ref 26.0–34.0)
MCHC: 32.6 g/dL (ref 30.0–36.0)
MCV: 93.5 fL (ref 80.0–100.0)
Platelets: 250 10*3/uL (ref 150–400)
RBC: 4 MIL/uL — ABNORMAL LOW (ref 4.22–5.81)
RDW: 14.2 % (ref 11.5–15.5)
WBC: 6.1 10*3/uL (ref 4.0–10.5)
nRBC: 0 % (ref 0.0–0.2)

## 2021-01-18 LAB — URINALYSIS, ROUTINE W REFLEX MICROSCOPIC
Bacteria, UA: NONE SEEN
Bilirubin Urine: NEGATIVE
Glucose, UA: NEGATIVE mg/dL
Hgb urine dipstick: NEGATIVE
Ketones, ur: NEGATIVE mg/dL
Leukocytes,Ua: NEGATIVE
Nitrite: NEGATIVE
Protein, ur: 30 mg/dL — AB
Specific Gravity, Urine: 1.013 (ref 1.005–1.030)
pH: 5 (ref 5.0–8.0)

## 2021-01-18 LAB — COMPREHENSIVE METABOLIC PANEL
ALT: 23 U/L (ref 0–44)
AST: 24 U/L (ref 15–41)
Albumin: 3.4 g/dL — ABNORMAL LOW (ref 3.5–5.0)
Alkaline Phosphatase: 77 U/L (ref 38–126)
Anion gap: 8 (ref 5–15)
BUN: 34 mg/dL — ABNORMAL HIGH (ref 6–20)
CO2: 22 mmol/L (ref 22–32)
Calcium: 9 mg/dL (ref 8.9–10.3)
Chloride: 106 mmol/L (ref 98–111)
Creatinine, Ser: 1.93 mg/dL — ABNORMAL HIGH (ref 0.61–1.24)
GFR, Estimated: 40 mL/min — ABNORMAL LOW (ref >60)
Glucose, Bld: 134 mg/dL — ABNORMAL HIGH (ref 70–99)
Potassium: 5.1 mmol/L (ref 3.5–5.1)
Sodium: 136 mmol/L (ref 135–145)
Total Bilirubin: 0.8 mg/dL (ref 0.3–1.2)
Total Protein: 6.3 g/dL — ABNORMAL LOW (ref 6.5–8.1)

## 2021-01-18 LAB — LIPASE, BLOOD: Lipase: 64 U/L — ABNORMAL HIGH (ref 11–51)

## 2021-01-18 NOTE — ED Triage Notes (Signed)
Pt had tooth extracted yesterday, compliant in taking amoxicillin 500mg  4xday prior to experiencing "significant" diarrhea, cramping, unable to keep things down, states things "go straight through" him.   Hx DM1, CKD2, stroke, complaint w at-home medications

## 2021-01-19 LAB — CBG MONITORING, ED
Glucose-Capillary: 71 mg/dL (ref 70–99)
Glucose-Capillary: 83 mg/dL (ref 70–99)

## 2021-01-19 MED ORDER — GLUCOSE 40 % PO GEL
1.0000 | Freq: Once | ORAL | Status: AC
  Administered 2021-01-19: 37.5 g via ORAL
  Filled 2021-01-19: qty 1

## 2021-01-19 MED ORDER — LOPERAMIDE HCL 2 MG PO CAPS: 2.0000 mg | ORAL_CAPSULE | Freq: Four times a day (QID) | ORAL | 0 refills | Status: DC | PRN

## 2021-01-19 MED ORDER — SODIUM CHLORIDE 0.9 % IV BOLUS
1000.0000 mL | Freq: Once | INTRAVENOUS | Status: AC
  Administered 2021-01-19: 1000 mL via INTRAVENOUS

## 2021-01-19 MED ORDER — ONDANSETRON HCL 4 MG/2ML IJ SOLN
4.0000 mg | Freq: Once | INTRAMUSCULAR | Status: AC
  Administered 2021-01-19: 4 mg via INTRAVENOUS
  Filled 2021-01-19: qty 2

## 2021-01-19 NOTE — Progress Notes (Addendum)
NEUROBEHAVIORAL STATUS EXAM   Name: Spencer Burke Date of Birth: 12-05-1960 Date of Interview: 04/04/2021  Reason for Referral:  [name] is a 60 y.o. male who was originally referred for neuropsychological evaluation by Dr. of [facility] due to concerns about. Today's visit is a follow-up for repeat neuropsychological testing for further diagnostic review.  This is a 23-month follow-up appointment.  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   History of Presenting Problem:   Spencer Burke. Recktenwald is a 60 year old male originally referred by Spencer Acre, MD for neuropsychological evaluation circa 2021. Dr. Kieth Brightly had also seen the patient over 20 years ago to assess for possibility of of adult residual attention deficit disorder; records not available for review. The patient has continued to be treated for adult residual attention deficit disorder and continues to take Adderall.  The patient was seen on August 14, 2019 with onset of gait instability.  The patient reports that he has identified a time at the sometime around the end of August or early September where he began having symptoms that have now been attributed to a stroke.  The patient reports that he called his doctor on October 6 after he started to collapse and fall and they ended up doing an MRI scan 2 months later and saw evidence of his stroke.  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  The patient has improved with physical therapy but continues to have episodes of gait instability.  The patient describes good days and bad days and difficulty on some days where he cannot walk and then others he may go on a 10 mile hike the next day.  The patient continues to describe issues with memory, attention and concentration deficits and weaknesses, word finding difficulties and balance issues.   The patient does have neuropathy and weakness.  The patient describes most of his cognitive issues have to do with memory changes, concentration issues and word finding problems and reports that these have worsened since his stroke.  The patient reports that he has fairly consistent sleep patterns traditionally and some snoring.  The patient reports that he is now started having some sleep issues.  Appetite is good.  The patient was hospitalized in November 2020 for 2 days because of stroke like event.  The patient has not worked since November 2020 because of his memory and attention issues, falling and imbalance issues and weakness.  The patient has been diagnosed with type 1 diabetes when he was 61-1/2 or 60 years old and is on an insulin pump.  The patient will have blood glucose levels reach 400 or above.  The patient reports that when it is below 90 he starts having trouble and his app for monitoring his blood glucose levels "goes off all the time.  The patient has been diagnosed with neuropathy.  The patient denies other cognitive changes and denies any expressive language changes, visual spatial changes or other areas of cognitive difficulties.  The patient denies any significant family history for progressive dementia she has although there is a history of transient ischemic attacks in both his parents.  The patient also had a neuropsychological evaluation performed through [facility] with Spencer Burke, Psy.D..  This evaluation was performed via video conference within the outpatient office in separate rooms due to Covid concerns.  The patient had some poor performances on validity measures and  therefore none of the other measures were felt to be valid or interpretable.  While the neuropsychologist discussed the in validity of the patient's test scores and felt that there were no medical issues that could be present for the development of memory difficulties and a discussion was made  as to the impact of psychiatric distress and cognitive functioning as the patient felt that there was something going on beyond a conversion disorder or simply his mood.  The patient has a long history of insulin-dependent diabetes with significant elevations in blood glucose levels, prior diagnostic testing suggesting longstanding attentional deficits performed by myself years ago as well as recent stroke event affecting brainstem functioning.  And while the stroke was not likely to affected higher cortical functioning there are some significant medical issues that over time could create some cognitive deficits and difficulties including adult residual attention deficit disorder.  Previous Neuropsychological Testing:  Clinical impressions from the previous evaluation (04/12/20) were as follows:   Overall, the results of the current neuropsychological evaluation are consistent with patterns typically seen with a history ofbrainstem lesions,lacunar infarcts, small vessel disease,and/orother cardiometabolic and pulmonary conditions (e.g., uncontrolled diabetes and hypertension). Consistent with findings on recentMRI(w/out contrast) that showedsmall right upper pontine/midbrain stroke event, well as objective neuropsychological test performance the patient is very likely having residual/ongoing effects of acquiredinjury to this region, which may negatively impact his bodies metabolic and regulatory processes.Lacunar infarcts are most often located in the basal ganglia structures, in the thalamus, and in the area of the internal capsule or deep hemispheric white matter and may be associated with focal syndromes, such as pure motor or a sensory loss event, the concurrence with dysarthria, and a "clumsy hand'or hemiparesis. Recent changes to his gate and worsening of balance and coordination are also likely residual symptoms of prior infarct within the pontine/midbrain area.The patient has a long history of  insulin-dependent diabetes with significant elevationsand fluctuationsin blood glucose levelsthat has reportedly been difficult to manage on his own; he has a history of adult residual ADHD and other significant medical issues that are likely playing a role in his current neuropsychological status.  Given the nature of the patient'srelatively slow processing speed, inattention, and difficulty with aspects of executive functioning including set shifting and sequencing,he is likely to experiencesome trouble executing moderate to complex tasks without making errors or in a timely manner.He is likely to get easily derailedwhen performing even basic or simpletasks and experiencesignificant frustration that may further reduce attention and executive functions (e.g., problem solving, organizing, decision making, etc.). Sensory and motor weakness, as well as balance and coordination may limit his physical activities to some degree.The patient has improved with physical therapy but continues to have episodes of gait instability. The patient describes good days and bad days and difficulty on some days where he cannot walk and then others he may go on a 10 mile hike the next day.Hehas a tendency tooverexert himselfto the point of exhaustion, and remains a high fall risk. We strongly recommend he and his providers continue to treat any cerebrovascular risk factors (e.g.,diabetes,blood pressure, diet, etc.) to prevent further decline.The prognosis for further decline is medium, as he may not experience further decline if his vascular risk factors and diabetes are well controlled.  Current Functioning: Work: Unemployed   Complex ADLs Driving: Able to perform independently without difficulty.  Medication management: Able to perform independently without difficulty.  Management of finances: Able to perform independently without difficulty.  Appointments: Able to perform independently without  difficulty.  Cooking: Able to perform independently without difficulty.   Medical/Physical complaints:  Any hx of stroke/TIA, MI, LOC/TBI, Sz? Right upper pontine/midbrain stroke event Hx falls? Yes, multiple in the last couple years. Balance, probs walking? Unsteady  Sleep: Insomnia? OSA? CPAP? REM sleep beh sx? Trouble falling and staying asleep. Visual illusions/hallucinations? Denies  Appetite/Nutrition/Weight changes: Denies   Current mood: Anxious and depressed   Behavioral disturbance/Personality change: Denies   Suicidal Ideation/Intention: Denies   Marital Status/Living:           The patient was born and [place] along with 2 siblings.  The patient was diagnosed with type 1 diabetes when he was 72-1/2 for 60 years old.  The patient lives with 2 housemates but he is in the process of moving in with his parents.  The patient was married in 1990 and was married for 25 years and divorced approximately 6 years ago.  The patient has 2 adult adopted daughters.  Current Employment:           The patient is not currently working and is out on disability.  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  Past Employment:                The patient does work as a Health visitor.  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  He has not been terminated from his job.  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  Substance Use:                     No concerns of substance abuse are reported.  The patient has alcohol once or twice a month and no other substance use.  Education:                             The patient received his bachelor's degree from [employer] with a 2.8 GPA average.  He also attended [address], [employer], and [employer] for  specialized training in his field.  Medical History: Past Medical History:  Diagnosis Date  . ADD (attention deficit disorder)   . Diabetes (HCC)    Current Medications:  Outpatient Encounter Medications as of 01/18/2021  Medication Sig  . amphetamine-dextroamphetamine (ADDERALL) 20 MG tablet Take 1 tablet (20 mg total) by mouth 2 (two) times daily.  [name] aspirin 325 MG tablet Take 325 mg by mouth daily.  [name] atorvastatin (LIPITOR) 80 MG tablet Take 80 mg by mouth daily.  [name] glucose blood test strip CHECK BLOOD SUGAR 10-12 TIMES DAILY  . insulin aspart (NOVOLOG) 100 UNIT/ML injection Inject into the skin 3 (three) times daily before meals. Use as directed for insulin pump.  . Insulin Human (INSULIN PUMP) SOLN by Intravenous (Continuous Infusion) route. 5 units, Basal rate continous  . lisinopril (PRINIVIL,ZESTRIL) 10 MG tablet TAKE ONE TABLET BY MOUTH DAILY (pt stated he is tritrating back up ot , taking  now)  . loperamide (IMODIUM) 2 MG capsule Take 1 capsule (2 mg total) by mouth 4 (four) times daily as needed for diarrhea or loose stools.  [name] loratadine (CLARITIN) 10 MG tablet Take 5 mg by mouth as needed.  . naproxen (NAPROSYN) 500 MG  tablet Take 1 tablet (500 mg total) by mouth 2 (two) times daily with a meal.  . tiZANidine (ZANAFLEX) 4 MG tablet Take 1 tablet (4 mg total) by mouth every 6 (six) hours as needed for muscle spasms.  . [EXPIRED] dextrose (GLUTOSE) 40 % oral gel 37.5 g   . [EXPIRED] ondansetron (ZOFRAN) injection 4 mg   . [EXPIRED] sodium chloride 0.9 % bolus 1,000 mL    No facility-administered encounter medications on file as of 01/18/2021.   Psychiatric History: No prior psychiatric history.  Behavioral Observations:    Appearance: Neatly, casually and appropriately dressed and groomed  Gait: Ambulated independently, no gross abnormalities observed. Unsteady on feet with abnormal gait  Speech: Fluent; normal rate, rhythm and volume. Minimal word finding  difficulty.  Thought Process: Linear, goal directed, and logical  Though Content: WNL; not suicidal and not homicidal  Affect: Full, anxious  Interactions:  Active Appropriate and Redirectable  Attention: Abnormal and attention span appeared shorter than expected for age  Memory: Abnormal; remote memory intact, recent memory impaired  Visuo-spatial: not examined  Orientation: Person, place, time/date and situation  Judgment: Good  Planning: Fair  Affect:  Appropriate  Mood:  Dysphoric  Insight:  Good  Intelligence: High  60 minutes spent face-to-face with patient completing neurobehavioral status exam. [id number] unit  TESTING: There is medical necessity to proceed with repeat neuropsychological assessment as the results will be used to aid in differential diagnosis and clinical decision-making and to inform specific treatment recommendations.   Clinical Decision Making: In considering the patient's current level of functioning, level of presumed impairment, nature of symptoms, emotional and behavioral responses during the interview, level of literacy, and observed level of motivation, a battery of tests was selected for patient to complete during a separate 4-hour testing appointment.   PLAN: The patient will return to complete the above referenced full battery of neuropsychological testing with our psychometrician under my supervision. Education regarding testing procedures was provided to the patient.   The patient was provided an opportunity to ask questions and all were answered. The patient agreed with the plan and demonstrated adequate understanding of the purpose.  Subsequently, the patient will see this provider for a follow-up session at which time his test performances and my impressions and treatment recommendations will be reviewed in detail.   Impression/DX:                     Deanna ArtisWilliam H. Hopping is a 60 year old male referred by Spencer Acreharles Willis, MD for  neuropsychological evaluation.  The patient was referred to Dr. Anne HahnWillis for neurological evaluation by his PCP Britt Boozerhristian Moretz, MD.  I had also seen the patient years ago for his evaluation as part of his diagnosis of adult residual attention deficit disorder.  I have looked back to try to find those records but could not find the records as they happened nearly 20 years ago.  The patient had some memory issues that were directly related to attentional deficits identified and that evaluation performed years ago.  The patient has continued to be treated for adult residual attention deficit disorder and continues to take Adderall.  The patient was seen on August 14, 2019 with onset of gait instability.  The patient reports that he has identified a time at the end of September where he began having symptoms that have now been attributed to a stroke.  The patient reports that he called his doctor on October 6 after he started to collapse  and fall and they ended up doing an MRI scan 2 months later and saw evidence of his stroke.  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  The patient has improved with physical therapy but continues to have episodes of gait instability.  The patient describes good days and bad days and difficulty on some days where he cannot walk and then others he may go on a 10 mile hike the next day.  The patient continues to describe issues with memory, attention and concentration deficits and weaknesses, word finding difficulties and balance issues.  The patient does have neuropathy and weakness.  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    _____________________________ [name],  PsyD Neuropsychologist

## 2021-01-19 NOTE — ED Provider Notes (Addendum)
[facility] EMERGENCY DEPARTMENT Provider Note   CSN: [id number] Arrival date & time: 01/18/21  2010     History Chief Complaint  Patient presents with  . Abdominal Pain  . Diarrhea    COSMO TETREAULT is a 60 y.o. male.  Patient presents to the emergency department with a chief complaint of nausea and diarrhea.  He denies having had vomiting.  States that his symptoms started 2 days ago.  He associates his symptoms with recent antibiotic use (amoxicillin) for a tooth extraction.  Patient has history of type 1 diabetes, and uses a insulin pump.  He states that his blood sugars have been stable, but he is afraid that they are going to start to downtrend with him not being able to keep anything in him.  He denies fevers, chills, cough.  Denies any significant abdominal pain, does report some generalized abdominal discomfort.  The history is provided by the patient. No language interpreter was used.       Past Medical History:  Diagnosis Date  . ADD (attention deficit disorder)   . Diabetes [facility])     Patient Active Problem List   Diagnosis Date Noted  . Attention deficit disorder 09/08/2014  . Insulin dependent type 1 diabetes mellitus (HCC) 11/04/2010    Past Surgical History:  Procedure Laterality Date  . FOOT FRACTURE SURGERY Left 2015   foot and ankle (foot this yr)  . MOUTH SURGERY    . NASAL SINUS SURGERY  06/2009  . REFRACTIVE SURGERY  04/2009  . SHOULDER SURGERY Left 09/2009       Family History  Problem Relation Age of Onset  . Transient ischemic attack Mother   . Transient ischemic attack Father     Social History   Tobacco Use  . Smoking status: Never Smoker  . Smokeless tobacco: Never Used  Substance Use Topics  . Alcohol use: No  . Drug use: No    Home Medications Prior to Admission medications   Medication Sig Start Date End Date Taking? Authorizing Provider  amphetamine-dextroamphetamine (ADDERALL) 20 MG tablet Take 1 tablet  (20 mg total) by mouth 2 (two) times daily. 11/24/19   York Spaniel, MD  aspirin 325 MG tablet Take 325 mg by mouth daily.    [provider]  atorvastatin (LIPITOR) 80 MG tablet Take 80 mg by mouth daily.    [provider]  glucose blood test strip CHECK BLOOD SUGAR 10-12 TIMES DAILY 04/05/15   [provider]  insulin aspart (NOVOLOG) 100 UNIT/ML injection Inject into the skin 3 (three) times daily before meals. Use as directed for insulin pump.    [provider]  Insulin Human (INSULIN PUMP) SOLN by Intravenous (Continuous Infusion) route. 5 units, Basal rate continous    [provider]  lisinopril (PRINIVIL,ZESTRIL) 10 MG tablet TAKE ONE TABLET BY MOUTH DAILY (pt stated he is tritrating back up ot 10mg , taking 5mg  now) 01/04/15   [provider]  loratadine (CLARITIN) 10 MG tablet Take 5 mg by mouth as needed.    [provider]  naproxen (NAPROSYN) 500 MG tablet Take 1 tablet (500 mg total) by mouth 2 (two) times daily with a meal. 11/19/20   03/05/15, PA-C  tiZANidine (ZANAFLEX) 4 MG tablet Take 1 tablet (4 mg total) by mouth every 6 (six) hours as needed for muscle spasms. 11/19/20   Wallis Bamberg, PA-C    Allergies    Tetracyclines & related  Review of  Systems   Review of Systems  All other systems reviewed and are negative.   Physical Exam Updated Vital Signs BP (!) 115/59 (BP Location: Left Arm)   Pulse 65   Temp 98.4 F (36.9 C) (Oral)   Resp 17   SpO2 99%   Physical Exam Vitals and nursing note reviewed.  Constitutional:      Appearance: He is well-developed and well-nourished.  HENT:     Head: Normocephalic and atraumatic.  Eyes:     Conjunctiva/sclera: Conjunctivae normal.  Cardiovascular:     Rate and Rhythm: Normal rate and regular rhythm.     Heart sounds: No murmur heard.   Pulmonary:     Effort: Pulmonary effort is normal. No respiratory distress.     Breath sounds: Normal breath sounds.   Abdominal:     Palpations: Abdomen is soft.     Tenderness: There is no abdominal tenderness.  Musculoskeletal:        General: No edema. Normal range of motion.     Cervical back: Neck supple.  Skin:    General: Skin is warm and dry.  Neurological:     Mental Status: He is alert and oriented to person, place, and time.  Psychiatric:        Mood and Affect: Mood and affect and mood normal.        Behavior: Behavior normal.     ED Results / Procedures / Treatments   Labs (all labs ordered are listed, but only abnormal results are displayed) Labs Reviewed  LIPASE, BLOOD - Abnormal; Notable for the following components:      Result Value   Lipase 64 (*)    All other components within normal limits  COMPREHENSIVE METABOLIC PANEL - Abnormal; Notable for the following components:   Glucose, Bld 134 (*)    BUN 34 (*)    Creatinine, Ser 1.93 (*)    Total Protein 6.3 (*)    Albumin 3.4 (*)    GFR, Estimated 40 (*)    All other components within normal limits  CBC - Abnormal; Notable for the following components:   RBC 4.00 (*)    Hemoglobin 12.2 (*)    HCT 37.4 (*)    All other components within normal limits  URINALYSIS, ROUTINE W REFLEX MICROSCOPIC - Abnormal; Notable for the following components:   Protein, ur 30 (*)    All other components within normal limits    EKG None  Radiology No results found.  Procedures Procedures   Medications Ordered in ED Medications  sodium chloride 0.9 % bolus 1,000 mL (has no administration in time range)  ondansetron (ZOFRAN) injection 4 mg (has no administration in time range)    ED Course  I have reviewed the triage vital signs and the nursing notes.  Pertinent labs & imaging results that were available during my care of the patient were reviewed by me and considered in my medical decision making (see chart for details).    MDM Rules/Calculators/A&P                          This patient complains of diarrhea and nausea,  this involves an extensive number of treatment options, and is a complaint that carries with it a high risk of complications and morbidity.    Differential Dx Medication reaction, gastroenteritis, colitis  Pertinent Labs I ordered, reviewed, and interpreted labs, which included no leukocytosis, creatinine mildly elevated at 1.93, up from  1.6, likely due to slight dehydration, lipase is 64, but I doubt pancreatitis.  He has not had any vomiting, does not have any significant epigastric abdominal tenderness.  Medications I ordered medication fluids and Zofran for dehydration and nausea.  Glucose also dropped to 71, he was given some glucose gel. Repeat CBG uptrending at 83.  Reassessments After the interventions stated above, I reevaluated the patient and found stable appearing.  He states that the diarrhea has decreased while in the ED, question whether this is due to lack of oral intake, or whether he is improving.  I ordered a C. difficile PCR, but advised patient that this will not return tonight.  I think that the likelihood of this being C. difficile is low.   Consultants None  Plan Discontinue antibiotics.   Start/resume probiotics Return for new or worsening symptoms. Discharge    Final Clinical Impression(s) / ED Diagnoses Final diagnoses:  Diarrhea, unspecified type    Rx / DC Orders ED Discharge Orders    None       Roxy Horseman, PA-C 01/19/21 [id number]    Roxy Horseman, PA-C 01/19/21 [id number]    Nira Conn, MD 01/19/21 [phone]

## 2021-04-04 NOTE — Addendum Note (Signed)
Addended by: Thayer Headings R on: 04/04/2021 10:06 AM   Modules accepted: Level of Service

## 2021-04-12 ENCOUNTER — Other Ambulatory Visit: Payer: Self-pay | Admitting: Specialist

## 2021-04-12 DIAGNOSIS — G901 Familial dysautonomia [Riley-Day]: Secondary | ICD-10-CM

## 2021-04-12 DIAGNOSIS — R55 Syncope and collapse: Secondary | ICD-10-CM

## 2021-04-17 ENCOUNTER — Ambulatory Visit (INDEPENDENT_AMBULATORY_CARE_PROVIDER_SITE_OTHER): Payer: 59

## 2021-04-17 ENCOUNTER — Other Ambulatory Visit: Payer: Self-pay

## 2021-04-17 DIAGNOSIS — R270 Ataxia, unspecified: Secondary | ICD-10-CM | POA: Diagnosis not present

## 2021-04-17 DIAGNOSIS — R55 Syncope and collapse: Secondary | ICD-10-CM

## 2021-04-17 DIAGNOSIS — R269 Unspecified abnormalities of gait and mobility: Secondary | ICD-10-CM | POA: Diagnosis not present

## 2021-04-17 DIAGNOSIS — G901 Familial dysautonomia [Riley-Day]: Secondary | ICD-10-CM | POA: Diagnosis not present

## 2021-04-21 ENCOUNTER — Other Ambulatory Visit: Payer: Self-pay

## 2021-04-21 ENCOUNTER — Encounter: Payer: 59 | Attending: Psychology | Admitting: Psychology

## 2021-04-21 DIAGNOSIS — F908 Attention-deficit hyperactivity disorder, other type: Secondary | ICD-10-CM | POA: Diagnosis present

## 2021-04-21 DIAGNOSIS — E1065 Type 1 diabetes mellitus with hyperglycemia: Secondary | ICD-10-CM | POA: Diagnosis present

## 2021-04-21 DIAGNOSIS — F0150 Vascular dementia without behavioral disturbance: Secondary | ICD-10-CM | POA: Insufficient documentation

## 2021-04-21 DIAGNOSIS — R269 Unspecified abnormalities of gait and mobility: Secondary | ICD-10-CM | POA: Insufficient documentation

## 2021-04-21 DIAGNOSIS — F01A0 Vascular dementia, mild, without behavioral disturbance, psychotic disturbance, mood disturbance, and anxiety: Secondary | ICD-10-CM

## 2021-04-21 DIAGNOSIS — F411 Generalized anxiety disorder: Secondary | ICD-10-CM | POA: Diagnosis present

## 2021-04-21 DIAGNOSIS — IMO0002 Reserved for concepts with insufficient information to code with codable children: Secondary | ICD-10-CM

## 2021-04-21 DIAGNOSIS — E1051 Type 1 diabetes mellitus with diabetic peripheral angiopathy without gangrene: Secondary | ICD-10-CM | POA: Insufficient documentation

## 2021-04-21 NOTE — Progress Notes (Signed)
   DOS:   04/21/21 Type of Tx:  Health Behavioral Intervention   Start Time:  2:00 PM  End Time:   3:00 PM  Subjective:    Patient ID: Spencer Burke is a 60 y.o. male.   DOB: 1961-09-18   MRN: [id number]  HPI Spencer Burke is a 60 y.o. male who presents to biweekly therapy appointment to develop/refine organization and time management skills, reduce procrastination, acquire healthy lifestyle habits for managing chronic medical conditions (I.e., type 1 diabetes) and reducing risk for another CVA, coping after with depression and generalized anxiety.   The following portions of the patient's history were reviewed and updated as appropriate: allergies, current medications, past family history, past medical history, past social history, past surgical history and problem list. Review of Systems  Objective:  Physical Exam Neurological:     Mental Status: He is alert.     Motor: Weakness present.     Coordination: Coordination abnormal.     Gait: Gait abnormal.  Psychiatric:        Attention and Perception: He is inattentive. He does not perceive auditory or visual hallucinations.        Mood and Affect: Mood is anxious and depressed. Affect is tearful.        Speech: Speech is tangential.        Behavior: Behavior is agitated (more easily) and slowed. Behavior is cooperative.        Thought Content: Thought content is paranoid (mild). Thought content is not delusional. Thought content does not include homicidal or suicidal ideation. Thought content does not include homicidal or suicidal plan.        Judgment: Judgment is impulsive.   Lab Review:  not applicable  Assessment:   Mild vascular neurocognitive disorder (HCC)  Type 1 diabetes, fairly controlled with neuropathy (HCC)  Adult residual type attention deficit hyperactivity disorder (ADHD)  Generalized anxiety disorder  Abnormality of gait   Intervention: Goal setting   Participation: Alert and Active    Response/Effectiveness: Fair-Good  Therapist Response: Set agenda. Reviewed recent mood and functioning since previous visit. Challenged patient to come up with internal and external triggers for anxiety and excessive worry. Reviewed recent anxiety provoking situations and recorded automatic thoughts, feelings, and behavior. Used cognitive restructuring to modify negative automatic thoughts by considering helpful alternatives. Processed changes in mood and explored potential behavioral responses. Reviewed sleep hygiene and goal setting. Assigned homework. Clarified expectations and answered all questions to the best of my ability.   Homework: Complete "Who, What, Where, When, How, and Why" questionnaire for weekly goal.  Plan:   Continue bi-weekly health behavioral interventions to develop and refine organization and time management skills, reduce procrastination, acquire healthy lifestyle habits for managing chronic medical conditions (I.e., type 1 diabetes) to reduce risk for future CVA, and build therapeutic coping skills for managing depressed mood and generalized anxiety.   Next therapy visit: 05/12/21  Billing/Service Summary:  [id number] (Health behavior intervention, individual, face-to-face; initial 30 minutes) x1 [phone] (Health behavior intervention, individual, face-to-face; each additional 15 minutes) x 2

## 2021-04-28 ENCOUNTER — Encounter: Payer: Self-pay | Admitting: Psychology

## 2021-05-12 ENCOUNTER — Other Ambulatory Visit: Payer: Self-pay

## 2021-05-12 ENCOUNTER — Encounter: Payer: 59 | Attending: Psychology | Admitting: Psychology

## 2021-05-12 DIAGNOSIS — F411 Generalized anxiety disorder: Secondary | ICD-10-CM | POA: Diagnosis not present

## 2021-05-12 DIAGNOSIS — R269 Unspecified abnormalities of gait and mobility: Secondary | ICD-10-CM | POA: Insufficient documentation

## 2021-05-12 DIAGNOSIS — F0150 Vascular dementia without behavioral disturbance: Secondary | ICD-10-CM | POA: Diagnosis not present

## 2021-05-12 DIAGNOSIS — F908 Attention-deficit hyperactivity disorder, other type: Secondary | ICD-10-CM | POA: Insufficient documentation

## 2021-05-12 DIAGNOSIS — E1065 Type 1 diabetes mellitus with hyperglycemia: Secondary | ICD-10-CM | POA: Diagnosis present

## 2021-05-12 DIAGNOSIS — IMO0002 Reserved for concepts with insufficient information to code with codable children: Secondary | ICD-10-CM

## 2021-05-12 DIAGNOSIS — E1051 Type 1 diabetes mellitus with diabetic peripheral angiopathy without gangrene: Secondary | ICD-10-CM | POA: Insufficient documentation

## 2021-05-12 DIAGNOSIS — F01A0 Vascular dementia, mild, without behavioral disturbance, psychotic disturbance, mood disturbance, and anxiety: Secondary | ICD-10-CM

## 2021-05-13 ENCOUNTER — Encounter: Payer: Self-pay | Admitting: Psychology

## 2021-05-13 NOTE — Progress Notes (Deleted)
Subjective:    Patient ID: Spencer Burke is a 60 y.o. male.  Chief Complaint: HPI {Common ambulatory SmartLinks:19316} Review of Systems  Objective:  Physical Exam  Lab Review:  {Recent labs:19471}  Assessment:   No diagnosis found.  Plan:   

## 2021-05-13 NOTE — Progress Notes (Deleted)
Subjective:    Patient ID: Spencer Burke is a 59 y.o. male.  Chief Complaint: HPI {Common ambulatory SmartLinks:19316} Review of Systems  Objective:  Physical Exam  Lab Review:  {Recent labs:19471}  Assessment:   No diagnosis found.  Plan:

## 2021-05-13 NOTE — Progress Notes (Signed)
   DOS:   05/12/21 Type of Tx:  Health Behavioral Intervention   Start Time:  2:00 PM End Time:   3:00 PM  Subjective:    Patient ID: Spencer Burke is a 60 y.o. male.   DOB: 1961-04-21   MRN: [id number]  HPI [name] is a 60 y.o. male who presents to biweekly therapy appointment to develop/refine organization and time management skills, reduce procrastination, acquire healthy lifestyle habits for managing chronic medical conditions (I.e., type 1 diabetes) and reducing risk for another CVA, coping after with depression and generalized anxiety.    The following portions of the patient's history were reviewed and updated as appropriate: allergies, current medications, past family history, past medical history, past social history, past surgical history and problem list.  Review of Systems  Objective:  Physical Exam Neurological:     Mental Status: He is alert.     Motor: Weakness present.     Coordination: Coordination abnormal.     Gait: Gait abnormal.  Psychiatric:        Attention and Perception: He is inattentive. He does not perceive auditory or visual hallucinations.        Mood and Affect: Mood is anxious and depressed. Affect is tearful.        Speech: Speech is tangential.        Behavior: Behavior is agitated (more easily) and slowed. Behavior is cooperative.        Thought Content: Thought content is paranoid (mild). Thought content is not delusional. Thought content does not include homicidal or suicidal ideation. Thought content does not include homicidal or suicidal plan.        Judgment: Judgment is impulsive.   Lab Review:  not applicable  Assessment:   Mild vascular neurocognitive disorder (HCC)  Type 1 diabetes, fairly controlled with neuropathy (HCC)  Adult residual type attention deficit hyperactivity disorder (ADHD)  Generalized anxiety disorder  Abnormality of gait   Intervention: Goal setting   Participation: Alert and Active    Response/Effectiveness: Fair-Good  Therapist Response: Assessed mood and neurologic symptoms. Reviewed homework and discussed barriers. Inquired about recent activity level. Reviewed organizational strategies and and time management skills to reduce procrastination and efficiency/productivity.   Informed patient that I will be resigning from the neuropsychology position effective 05/13/21 and discussed opportunity to follow-up with Dr. Kieth Brightly. Reviewed and processed time spent in therapy. Terminated therapeutic relationship.    Homework: Complete "Who, What, Where, When, How, and Why" questionnaire for weekly goal- did not complete   Plan:   Patient will follow up with Dr. Kieth Brightly on 06/13/21 to re-assess needs and determine type/duration/frequency of future therapeutic services   Billing/Service Summary:  [phone] (Health behavior intervention, individual, face-to-face; initial 30 minutes) x1 [phone] (Health behavior intervention, individual, face-to-face; each additional 15 minutes) x 2

## 2021-05-26 ENCOUNTER — Ambulatory Visit: Payer: 59 | Admitting: Psychology

## 2021-05-31 DIAGNOSIS — Z0271 Encounter for disability determination: Secondary | ICD-10-CM

## 2021-06-09 ENCOUNTER — Ambulatory Visit: Payer: 59 | Admitting: Psychology

## 2021-06-13 ENCOUNTER — Encounter: Payer: 59 | Admitting: Psychology

## 2021-06-23 ENCOUNTER — Ambulatory Visit: Payer: 59 | Admitting: Psychology

## 2021-07-04 ENCOUNTER — Encounter: Payer: 59 | Attending: Psychology | Admitting: Psychology

## 2021-07-04 ENCOUNTER — Other Ambulatory Visit: Payer: Self-pay

## 2021-07-04 ENCOUNTER — Encounter: Payer: Self-pay | Admitting: Psychology

## 2021-07-04 DIAGNOSIS — F0150 Vascular dementia without behavioral disturbance: Secondary | ICD-10-CM | POA: Insufficient documentation

## 2021-07-04 DIAGNOSIS — F908 Attention-deficit hyperactivity disorder, other type: Secondary | ICD-10-CM

## 2021-07-04 DIAGNOSIS — IMO0002 Reserved for concepts with insufficient information to code with codable children: Secondary | ICD-10-CM

## 2021-07-04 DIAGNOSIS — E1065 Type 1 diabetes mellitus with hyperglycemia: Secondary | ICD-10-CM | POA: Insufficient documentation

## 2021-07-04 DIAGNOSIS — E1051 Type 1 diabetes mellitus with diabetic peripheral angiopathy without gangrene: Secondary | ICD-10-CM

## 2021-07-04 DIAGNOSIS — F01A0 Vascular dementia, mild, without behavioral disturbance, psychotic disturbance, mood disturbance, and anxiety: Secondary | ICD-10-CM

## 2021-07-04 DIAGNOSIS — F411 Generalized anxiety disorder: Secondary | ICD-10-CM | POA: Diagnosis present

## 2021-07-04 NOTE — Progress Notes (Signed)
07/04/2021: 8 AM-9 AM  Today's visit was an in person visit that was conducted in my outpatient clinic office.  The patient myself were present for this visit.  This is a follow-up therapeutic visit.  The patient has been working with Dr. Vella Kohler for psychotherapeutic interventions until recently.  Dr. Teodoro Spray has left this practice and the patient was scheduled to follow-up with me.  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  The patient has not been able to return to work.  The patient gets easily distracted when performing even basic or simple tasks and experiences significant frustration which has a further exacerbating impact on attention and executive functions including problem-solving, organizing, decision-making etc.  Sensory and motor weaknesses are also noted.  The patient has had ongoing PT working on motor functions and coordination but continues to struggle particularly with residual cognitive changes consistent with midbrain and deep brain region impairments.  Today we worked on establishing some of the goals and working on compensatory strategies to deal with his significant residual changes in executive functioning and attention.  The patient will follow-up with me in approximately 4 months and we have set up some specific coping strategies etc. particular around follow-through and coping with his residual cognitive deficits.  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

## 2021-07-07 ENCOUNTER — Ambulatory Visit: Payer: 59 | Admitting: Psychology

## 2021-07-21 ENCOUNTER — Ambulatory Visit: Payer: 59 | Admitting: Psychology

## 2021-08-04 ENCOUNTER — Ambulatory Visit: Payer: 59 | Admitting: Psychology

## 2021-10-19 ENCOUNTER — Encounter: Payer: 59 | Attending: Psychology | Admitting: Psychology

## 2021-10-19 ENCOUNTER — Other Ambulatory Visit: Payer: Self-pay

## 2021-10-19 DIAGNOSIS — F01A0 Vascular dementia, mild, without behavioral disturbance, psychotic disturbance, mood disturbance, and anxiety: Secondary | ICD-10-CM

## 2021-10-19 DIAGNOSIS — R419 Unspecified symptoms and signs involving cognitive functions and awareness: Secondary | ICD-10-CM

## 2021-10-19 DIAGNOSIS — F908 Attention-deficit hyperactivity disorder, other type: Secondary | ICD-10-CM | POA: Diagnosis not present

## 2021-10-19 DIAGNOSIS — F411 Generalized anxiety disorder: Secondary | ICD-10-CM

## 2021-10-19 DIAGNOSIS — R269 Unspecified abnormalities of gait and mobility: Secondary | ICD-10-CM | POA: Diagnosis not present

## 2021-10-20 ENCOUNTER — Encounter: Payer: Self-pay | Admitting: Psychology

## 2021-10-20 NOTE — Progress Notes (Signed)
10/19/2021 4 PM-5 PM:  Today's visit was an in person visit.  The patient myself were present for this visit.  This is a follow-up therapeutic visit.  The patient has continued to work on some of the therapeutic strategies we talked about in a previous visit.  The patient continues to have significant attentional deficits that are worse than the previous identified issues with his ADHD.  He is having more anxiety symptoms as well.  The patient's motor deficits and attentional deficits continue to be problematic and he is having cognitive issues related to shifting deficits, making anxiety and worry worse.  He has improved his motor functioning but continues to have residual effects of his cerebrovascular event.  We continue to work on establishing strategies for adjusting and.  He has not returned to work and is unable to work at this point.  The patient has completed Social Security disability review and has been granted his disability status.  He would like to, however, find something that he can do at least part-time as he continues to have a great deal of knowledge base but his ability to stay on task and communicate are quite problematic for him.

## 2021-11-16 ENCOUNTER — Encounter: Payer: 59 | Admitting: Psychology

## 2021-12-06 ENCOUNTER — Ambulatory Visit: Payer: 59 | Admitting: Psychology

## 2021-12-20 ENCOUNTER — Ambulatory Visit: Payer: 59 | Admitting: Psychology

## 2022-01-03 ENCOUNTER — Ambulatory Visit: Payer: 59 | Admitting: Psychology

## 2022-01-10 ENCOUNTER — Ambulatory Visit (INDEPENDENT_AMBULATORY_CARE_PROVIDER_SITE_OTHER): Payer: 59

## 2022-01-10 ENCOUNTER — Other Ambulatory Visit: Payer: Self-pay | Admitting: Specialist

## 2022-01-10 ENCOUNTER — Other Ambulatory Visit: Payer: Self-pay

## 2022-01-10 DIAGNOSIS — Z8673 Personal history of transient ischemic attack (TIA), and cerebral infarction without residual deficits: Secondary | ICD-10-CM

## 2022-01-18 ENCOUNTER — Other Ambulatory Visit: Payer: Self-pay

## 2022-01-18 ENCOUNTER — Encounter: Payer: 59 | Attending: Psychology | Admitting: Psychology

## 2022-01-18 DIAGNOSIS — F411 Generalized anxiety disorder: Secondary | ICD-10-CM | POA: Diagnosis not present

## 2022-01-18 DIAGNOSIS — R419 Unspecified symptoms and signs involving cognitive functions and awareness: Secondary | ICD-10-CM | POA: Diagnosis present

## 2022-01-18 DIAGNOSIS — F01A0 Vascular dementia, mild, without behavioral disturbance, psychotic disturbance, mood disturbance, and anxiety: Secondary | ICD-10-CM | POA: Insufficient documentation

## 2022-01-18 DIAGNOSIS — I6990 Unspecified sequelae of unspecified cerebrovascular disease: Secondary | ICD-10-CM | POA: Diagnosis not present

## 2022-01-18 DIAGNOSIS — R413 Other amnesia: Secondary | ICD-10-CM | POA: Diagnosis present

## 2022-01-18 DIAGNOSIS — F908 Attention-deficit hyperactivity disorder, other type: Secondary | ICD-10-CM | POA: Diagnosis not present

## 2022-01-19 ENCOUNTER — Encounter: Payer: Self-pay | Admitting: Psychology

## 2022-01-19 NOTE — Progress Notes (Signed)
01/19/2022 9 AM-10 AM: Today's visit was an in person visit was conducted in my outpatient clinic office with the patient myself present.  I have continued to follow-up with the patient building on the initial therapeutic interventions and work that he had done with Dr. Dimple Burke here in our clinic regarding continued ongoing difficulties including attentional deficits and motor deficits following his CVA in the setting of significant brittle diabetes (type 1 diabetes with long-term use of insulin pump).  The patient had a cerebrovascular event likely around August 14, 2019 producing acute onset of gait instability.  The symptoms persisted throughout September but he did not address this with his physician until the early part of October 2020.  An MRI scan was done 2 months after the development of the symptoms showing evidence of a small right upper pontine/midbrain stroke event that was subacute in nature.  The patient had physical therapy but continued to have gait instability and worsening attention and focus capacity.  While the patient had longstanding issues related to adult residual attention deficit disorder that I actually had seen him for nearly 20 years ago he had had acute worsening of his attentional capacity on top of these more recent disturbance in gait and motor functioning.  The patient has continued to struggle with his worsening attentional deficits likely compounded by subcortical cerebrovascular event as well as his motor deficits that developed likely due to pontine involvement in his cerebrovascular event.  The patient has not been able to return to work and while he has continued to try to do is much as he can and stay active the underlying changes from his subcortical stroke event have continued to leave him unable to maintain full-time gainful employment.  The patient continues to work on therapeutic efforts to improve functioning but the inability to stay focused for any length of  time, organize his thoughts in an effective structured manner compounded by his changes in gait and motor control have left him unable to return to work.  A more recent MRI conducted in February 2023 that was requested after a notice of progressive voice/muscle weakness and balance difficulties over the prior 2 years was made making direct comparisons to previous MRIs conducted in 2020 as well as May 2022.  This most recent MRI showed no evidence of acute intracranial abnormality and that his most recent MRI was essentially stable compared to previous MRIs in May 2022.  The patient was not able to have a contrast MRI due to allergic response to contrast dyes.  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  The patient actually showed only mild generalized cerebral atrophy and minimal chronic Small vessel ischemic changes within the cerebral white matter regions which are always of great concern with individuals having his long history as he has of type 1 diabetes and the brittle nature of his diabetes.  There may be some worsening in his chronic small vessel ischemic patterns and given the region and nature of these changes on top of his previous pontine infarction they could explain changes in voice volume and more changes in weakness in motor functioning.  I was sent a request from Spencer Schilder, PhD as part of his peer-reviewed for the patient with specific questions that he had.  The question directed included "from a psychological or neuropsychological standpoint, can you please provide clinical findings that you deem consistent with occupational activity restrictions.  This is my response to  that request for information's.  The patient has a long history of adult residual attention deficit disorder that was actually seen and addressed by myself and his physicians nearly 20 years ago.  The patient began taking Adderall sometime ago but was able to  effectively maintain efficient and effective full-time gainful employment throughout that time.  The patient has been dealing with significant type 1 diabetes since he was a teenager and has been on an insulin pump for his entire adulthood.  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  The patient developed a significant worsening in his attentional capacity including significant distractibility, inability to remain focused and increasingly distractible from his previous status.  The patient also developed significant gait and motor abnormalities following his CVA.  While the patient is worked diligently to try to improve functioning at the level of cognitive change around attentional capacity and significant changes in motor capacity have left him unable to maintain full-time gainful employment.  The patient is very distractible much greater now than it had been previously and mildly continues to take Adderall it is not sufficient to help him with his attentional weaknesses and worsening of attentional capacity to a point needed for maintenance of effective occupational work.  The patient has lost significant motor function producing gait abnormalities.  While the patient continues with physical therapy and personal efforts to strengthen and maintain balance and motor functioning they continue to be impaired.  The patient has had a progressive worsening in motor functioning with concerns to the point of repeat MRI scans in both May 2022 and more recently in February 2023.  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  This is not uncommon with regard to decades of type 1 diabetes and the need for insulin pump.  Even with close and  diligent efforts around managing his type 1 diabetes further impacts on small vessel disease and ongoing heightened risk of lacunar type strokes remains.  The patient has ongoing restrictions with regard to returning to work and his attentional and focus abilities and they are worsening following his CVA are primarily work limitations.  Spencer Roys, Spencer Burke  Clinical neuropsychologist

## 2022-02-28 ENCOUNTER — Other Ambulatory Visit: Payer: Self-pay

## 2022-02-28 ENCOUNTER — Other Ambulatory Visit: Payer: Self-pay | Admitting: Specialist

## 2022-02-28 ENCOUNTER — Encounter: Payer: 59 | Attending: Psychology | Admitting: Psychology

## 2022-02-28 DIAGNOSIS — M6281 Muscle weakness (generalized): Secondary | ICD-10-CM

## 2022-02-28 DIAGNOSIS — F01A0 Vascular dementia, mild, without behavioral disturbance, psychotic disturbance, mood disturbance, and anxiety: Secondary | ICD-10-CM | POA: Diagnosis not present

## 2022-02-28 DIAGNOSIS — R413 Other amnesia: Secondary | ICD-10-CM | POA: Insufficient documentation

## 2022-02-28 DIAGNOSIS — F411 Generalized anxiety disorder: Secondary | ICD-10-CM | POA: Insufficient documentation

## 2022-02-28 DIAGNOSIS — I6990 Unspecified sequelae of unspecified cerebrovascular disease: Secondary | ICD-10-CM | POA: Insufficient documentation

## 2022-02-28 DIAGNOSIS — R419 Unspecified symptoms and signs involving cognitive functions and awareness: Secondary | ICD-10-CM | POA: Diagnosis present

## 2022-02-28 DIAGNOSIS — F908 Attention-deficit hyperactivity disorder, other type: Secondary | ICD-10-CM | POA: Insufficient documentation

## 2022-03-01 ENCOUNTER — Encounter: Payer: Self-pay | Admitting: Psychology

## 2022-03-01 NOTE — Progress Notes (Signed)
3/28/202 9 AM-10 AM: Today's visit was an in person visit that was conducted in my outpatient clinic office. ? ?The patient has continued to struggle with residual effects of his CVA including significant issues related to cognitive function and memory.  The patient reports that he is able to process Gestalt type information but has difficulty with specifics and completing tasks.  While the patient has a history of attentional deficits in the past he has continued to struggle with worsening symptoms following his cerebrovascular event was primarily subcortical in nature.  The patient has not been able to return to work and continues to be disabled.  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  We continue to work on therapeutic interventions ? ?I have continued to follow-up with the patient building on the initial therapeutic interventions and work that he had done with Dr. Audelia Hives here in our clinic regarding continued ongoing difficulties including attentional deficits and motor deficits following his CVA in the setting of significant brittle diabetes (type 1 diabetes with long-term use of insulin pump).  The patient had a cerebrovascular event likely around August 14, 2019 producing acute onset of gait instability.  The symptoms persisted throughout September but he did not address this with his physician until the early part of October 2020.  An MRI scan was done 2 months after the development of the symptoms showing evidence of a small right upper pontine/midbrain stroke event that was subacute in nature.  The patient had physical therapy but continued to have gait instability and worsening attention and focus capacity.  While the patient had longstanding issues related to adult residual attention deficit disorder that I actually had seen him for nearly 20 years ago he had had acute worsening of his attentional  capacity on top of these more recent disturbance in gait and motor functioning.  The patient has continued to struggle with his worsening attentional deficits likely compounded by subcortical cerebrovascular event as well as his motor deficits that developed likely due to pontine involvement in his cerebrovascular event.  The patient has not been able to return to work and while he has continued to try to do is much as he can and stay active the underlying changes from his subcortical stroke event have continued to leave him unable to maintain full-time gainful employment.  The patient continues to work on therapeutic efforts to improve functioning but the inability to stay focused for any length of time, organize his thoughts in an effective structured manner compounded by his changes in gait and motor control have left him unable to return to work.  A more recent MRI conducted in February 2023 that was requested after a notice of progressive voice/muscle weakness and balance difficulties over the prior 2 years was made making direct comparisons to previous MRIs conducted in 2020 as well as May 2022.  This most recent MRI showed no evidence of acute intracranial abnormality and that his most recent MRI was essentially stable compared to previous MRIs in May 2022.  The patient was not able to have a contrast MRI due to allergic response to contrast dyes.  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  The patient actually showed only mild generalized cerebral atrophy and minimal chronic ?Small vessel ischemic changes within the cerebral white matter regions which are always  of great concern with individuals having his long history as he has of type 1 diabetes and the brittle nature of his diabetes.  There may be some worsening in his chronic small vessel ischemic patterns and given the region and nature of these changes on top of his previous pontine infarction they  could explain changes in voice volume and more changes in weakness in motor functioning. ? ?I was sent a request from Lupe Carney, PhD as part of his peer-reviewed for the patient with specific questions that he had.  The question directed included "from a psychological or neuropsychological standpoint, can you please provide clinical findings that you deem consistent with occupational activity restrictions. ? ?This is my response to that request for information's.  The patient has a long history of adult residual attention deficit disorder that was actually seen and addressed by myself and his physicians nearly 20 years ago.  The patient began taking Adderall sometime ago but was able to effectively maintain efficient and effective full-time gainful employment throughout that time.  The patient has been dealing with significant type 1 diabetes since he was a teenager and has been on an insulin pump for his entire adulthood.  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  The patient developed a significant worsening in his attentional capacity including significant distractibility, inability to remain focused and increasingly distractible from his previous status.  The patient also developed significant gait and motor abnormalities following his CVA.  While the patient is worked diligently to try to improve functioning at the level of cognitive change around attentional capacity and significant changes in motor capacity have left him unable to maintain full-time gainful employment.  The patient is very distractible much greater now than it had been previously and mildly continues to take Adderall it is not sufficient to help him with his attentional weaknesses and worsening of attentional capacity to a point needed for maintenance of effective occupational work.   The patient has lost significant motor function producing gait abnormalities.  While the patient continues with physical therapy and personal efforts to strengthen and maintain balance and motor functioning they continue to be impaired.  The patient has had a progressive worsening in motor functioning with concerns to the point of repeat MRI scans in both May 2022 and more recently in February 2023.  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  This is not uncommon with regard to decades of type 1 diabetes and the need for insulin pump.  Even with close and diligent efforts around managing his type 1 diabetes further impacts on small vessel disease and ongoing heightened risk of lacunar type strokes remains.  The patient has ongoing restrictions with regard to returning to work and his attentional and focus abilities and they are worsening following his CVA are primarily work limitations. ? ?Hershal Coria, PsyD ? ?Clinical neuropsychologist ? ?

## 2022-03-06 ENCOUNTER — Ambulatory Visit (INDEPENDENT_AMBULATORY_CARE_PROVIDER_SITE_OTHER): Payer: 59

## 2022-03-06 DIAGNOSIS — M6281 Muscle weakness (generalized): Secondary | ICD-10-CM

## 2022-11-05 ENCOUNTER — Encounter (HOSPITAL_COMMUNITY): Payer: Self-pay

## 2022-11-05 ENCOUNTER — Emergency Department (HOSPITAL_COMMUNITY)
Admission: EM | Admit: 2022-11-05 | Discharge: 2022-11-05 | Disposition: A | Discharge status: Home / Self Care | Payer: Medicare Other | Attending: Student | Admitting: Student

## 2022-11-05 ENCOUNTER — Other Ambulatory Visit: Payer: Self-pay

## 2022-11-05 DIAGNOSIS — S301XXA Contusion of abdominal wall, initial encounter: Secondary | ICD-10-CM | POA: Insufficient documentation

## 2022-11-05 DIAGNOSIS — N179 Acute kidney failure, unspecified: Secondary | ICD-10-CM | POA: Insufficient documentation

## 2022-11-05 DIAGNOSIS — E109 Type 1 diabetes mellitus without complications: Secondary | ICD-10-CM | POA: Insufficient documentation

## 2022-11-05 DIAGNOSIS — Z7982 Long term (current) use of aspirin: Secondary | ICD-10-CM | POA: Insufficient documentation

## 2022-11-05 DIAGNOSIS — X58XXXA Exposure to other specified factors, initial encounter: Secondary | ICD-10-CM | POA: Insufficient documentation

## 2022-11-05 DIAGNOSIS — Z794 Long term (current) use of insulin: Secondary | ICD-10-CM | POA: Diagnosis not present

## 2022-11-05 DIAGNOSIS — T148XXA Other injury of unspecified body region, initial encounter: Secondary | ICD-10-CM

## 2022-11-05 LAB — CBC
HCT: 36.3 % — ABNORMAL LOW (ref 39.0–52.0)
Hemoglobin: 11.5 g/dL — ABNORMAL LOW (ref 13.0–17.0)
MCH: 29.9 pg (ref 26.0–34.0)
MCHC: 31.7 g/dL (ref 30.0–36.0)
MCV: 94.5 fL (ref 80.0–100.0)
Platelets: 187 10*3/uL (ref 150–400)
RBC: 3.84 MIL/uL — ABNORMAL LOW (ref 4.22–5.81)
RDW: 14.3 % (ref 11.5–15.5)
WBC: 6.9 10*3/uL (ref 4.0–10.5)
nRBC: 0 % (ref 0.0–0.2)

## 2022-11-05 LAB — URINALYSIS, ROUTINE W REFLEX MICROSCOPIC
Bacteria, UA: NONE SEEN
Bilirubin Urine: NEGATIVE
Glucose, UA: NEGATIVE mg/dL
Hgb urine dipstick: NEGATIVE
Ketones, ur: NEGATIVE mg/dL
Leukocytes,Ua: NEGATIVE
Nitrite: NEGATIVE
Protein, ur: 30 mg/dL — AB
Specific Gravity, Urine: 1.008 (ref 1.005–1.030)
pH: 5 (ref 5.0–8.0)

## 2022-11-05 LAB — BASIC METABOLIC PANEL
Anion gap: 9 (ref 5–15)
BUN: 52 mg/dL — ABNORMAL HIGH (ref 6–20)
CO2: 20 mmol/L — ABNORMAL LOW (ref 22–32)
Calcium: 9 mg/dL (ref 8.9–10.3)
Chloride: 107 mmol/L (ref 98–111)
Creatinine, Ser: 2.37 mg/dL — ABNORMAL HIGH (ref 0.61–1.24)
GFR, Estimated: 31 mL/min — ABNORMAL LOW (ref >60)
Glucose, Bld: 121 mg/dL — ABNORMAL HIGH (ref 70–99)
Potassium: 5.2 mmol/L — ABNORMAL HIGH (ref 3.5–5.1)
Sodium: 136 mmol/L (ref 135–145)

## 2022-11-05 LAB — PROTIME-INR
INR: 1 (ref 0.8–1.2)
Prothrombin Time: 13.4 seconds (ref 11.4–15.2)

## 2022-11-05 LAB — APTT: aPTT: 30 seconds (ref 24–36)

## 2022-11-05 LAB — FIBRINOGEN: Fibrinogen: 318 mg/dL (ref 210–475)

## 2022-11-05 MED ORDER — LACTATED RINGERS IV BOLUS
1000.0000 mL | Freq: Once | INTRAVENOUS | Status: AC
  Administered 2022-11-05: 1000 mL via INTRAVENOUS

## 2022-11-05 NOTE — ED Triage Notes (Signed)
Patient is a DM and recently started on lamictal and now has bruising to abd where he injects insulin and side effects for lamictal reported to come to ER for significant bruising or bleeding.  Patient has no other complaints.

## 2022-11-05 NOTE — ED Provider Triage Note (Signed)
Emergency Medicine Provider Triage Evaluation Note  Spencer Burke , a 61 y.o. male  was evaluated in triage.  Pt complains of abdominal bruising. Pt is T1DM, uses dexcom insulin monitoring. Recently been put on lamictal for possible seizures after stroke. Having significant bruising on his abdomen where his insulin needle was removed. Checked the medication instructions and they recommended coming to the ER for excessive bleeding/bruising.  Review of Systems  Positive: Abdominal bruising, sores on nose Negative: Hematuria, blood in stool  Physical Exam  BP (!) 152/70 (BP Location: Right Arm)   Pulse 61   Temp 98.2 F (36.8 C) (Oral)   Resp 16   SpO2 100%  Gen:   Awake, no distress   Resp:  Normal effort  MSK:   Moves extremities without difficulty  Other:    Medical Decision Making  Medically screening exam initiated at 11:36 AM.  Appropriate orders placed.  Deanna Artis Buerger was informed that the remainder of the evaluation will be completed by another provider, this initial triage assessment does not replace that evaluation, and the importance of remaining in the ED until their evaluation is complete.  Will obtain coagulopathy workup   Alexine Pilant T, PA-C 11/05/22 1138

## 2022-11-05 NOTE — ED Provider Notes (Signed)
[facility] EMERGENCY DEPARTMENT Provider Note  CSN: [id number] Arrival date & time: 11/05/22 1059  Chief Complaint(s) Bleeding/Bruising  HPI Spencer Burke is a 61 y.o. male with PMH type 1 diabetes, ADD who presents emergency department for evaluation of abnormal bruising.  Patient states that he has been using his Dexcom on his abdomen and over the the last 2 days he has noticed worsening bruising around the Dexcom site.  He removed it and placed it on another abdominal site that also began to bruise abnormally and comes to the emergency department due to concern for abnormal bruising.  He was recently placed on Lamictal and is unsure this is causing his bruising.  Denies abdominal pain, nausea, vomiting, headache, fever or other systemic symptoms.   Past Medical History Past Medical History:  Diagnosis Date   ADD (attention deficit disorder)    Diabetes [facility])    Patient Active Problem List   Diagnosis Date Noted   Attention deficit disorder 09/08/2014   Insulin dependent type 1 diabetes mellitus (HCC) 11/04/2010   Home Medication(s) Prior to Admission medications   Medication Sig Start Date End Date Taking? Authorizing Provider  amphetamine-dextroamphetamine (ADDERALL) 20 MG tablet Take 1 tablet (20 mg total) by mouth 2 (two) times daily. 11/24/19   York Spaniel, MD  aspirin 325 MG tablet Take 325 mg by mouth daily.    [provider]  atorvastatin (LIPITOR) 80 MG tablet Take 80 mg by mouth daily.    [provider]  glucose blood test strip CHECK BLOOD SUGAR 10-12 TIMES DAILY 04/05/15   [provider]  insulin aspart (NOVOLOG) 100 UNIT/ML injection Inject into the skin 3 (three) times daily before meals. Use as directed for insulin pump.    [provider]  Insulin Human (INSULIN PUMP) SOLN by Intravenous (Continuous Infusion) route. 5 units, Basal rate continous    [provider]  lisinopril (PRINIVIL,ZESTRIL) 10  MG tablet TAKE ONE TABLET BY MOUTH DAILY (pt stated he is tritrating back up ot 10mg , taking 5mg  now) 01/04/15   [provider]  loperamide (IMODIUM) 2 MG capsule Take 1 capsule (2 mg total) by mouth 4 (four) times daily as needed for diarrhea or loose stools. 01/19/21   03/05/15, PA-C  loratadine (CLARITIN) 10 MG tablet Take 5 mg by mouth as needed.    [provider]  naproxen (NAPROSYN) 500 MG tablet Take 1 tablet (500 mg total) by mouth 2 (two) times daily with a meal. 11/19/20   Roxy Horseman, PA-C  tiZANidine (ZANAFLEX) 4 MG tablet Take 1 tablet (4 mg total) by mouth every 6 (six) hours as needed for muscle spasms. 11/19/20   Wallis Bamberg, PA-C                                                                                                                                    Past Surgical History Past  Surgical History:  Procedure Laterality Date   FOOT FRACTURE SURGERY Left 2015   foot and ankle (foot this yr)   MOUTH SURGERY     NASAL SINUS SURGERY  06/2009   REFRACTIVE SURGERY  04/2009   SHOULDER SURGERY Left 09/2009   Family History Family History  Problem Relation Age of Onset   Transient ischemic attack Mother    Transient ischemic attack Father     Social History Social History   Tobacco Use   Smoking status: Never   Smokeless tobacco: Never  Substance Use Topics   Alcohol use: No   Drug use: No   Allergies Tetracyclines & related  Review of Systems Review of Systems  Hematological:  Bruises/bleeds easily.    Physical Exam Vital Signs  I have reviewed the triage vital signs BP 136/70 (BP Location: Left Arm)   Pulse (!) 56   Temp 98.1 F (36.7 C) (Oral)   Resp 16   Ht 5\' 9"  (1.753 m)   Wt 77.1 kg   SpO2 100%   BMI 25.10 kg/m   Physical Exam Constitutional:      General: He is not in acute distress.    Appearance: Normal appearance.  HENT:     Head: Normocephalic and atraumatic.     Nose: No congestion or rhinorrhea.  Eyes:      General:        Right eye: No discharge.        Left eye: No discharge.     Extraocular Movements: Extraocular movements intact.     Pupils: Pupils are equal, round, and reactive to light.  Cardiovascular:     Rate and Rhythm: Normal rate and regular rhythm.     Heart sounds: No murmur heard. Pulmonary:     Effort: No respiratory distress.     Breath sounds: No wheezing or rales.  Abdominal:     General: There is no distension.     Tenderness: There is no abdominal tenderness.  Musculoskeletal:        General: Normal range of motion.     Cervical back: Normal range of motion.  Skin:    General: Skin is warm and dry.     Findings: Bruising present.  Neurological:     General: No focal deficit present.     Mental Status: He is alert.     ED Results and Treatments Labs (all labs ordered are listed, but only abnormal results are displayed) Labs Reviewed  CBC - Abnormal; Notable for the following components:      Result Value   RBC 3.84 (*)    Hemoglobin 11.5 (*)    HCT 36.3 (*)    All other components within normal limits  BASIC METABOLIC PANEL - Abnormal; Notable for the following components:   Potassium 5.2 (*)    CO2 20 (*)    Glucose, Bld 121 (*)    BUN 52 (*)    Creatinine, Ser 2.37 (*)    GFR, Estimated 31 (*)    All other components within normal limits  URINALYSIS, ROUTINE W REFLEX MICROSCOPIC - Abnormal; Notable for the following components:   Color, Urine STRAW (*)    Protein, ur 30 (*)    All other components within normal limits  FIBRINOGEN  PROTIME-INR  APTT  Radiology No results found.  Pertinent labs & imaging results that were available during my care of the patient were reviewed by me and considered in my medical decision making (see MDM for details).  Medications Ordered in ED Medications  lactated ringers bolus 1,000 mL (0  mLs Intravenous Stopped 11/05/22 1604)                                                                                                                                     Procedures Procedures  (including critical care time)  Medical Decision Making / ED Course   This patient presents to the ED for concern of abnormal bruising, this involves an extensive number of treatment options, and is a complaint that carries with it a high risk of complications and morbidity.  The differential diagnosis includes superficial venous injury, coagulopathy, platelet dysfunction from underlying renal disease, malfitting Dexcom device  MDM: Patient seen emergency room for evaluation of abnormal bruising.  Physical exam with 2 circular areas of bruising in the abdomen at the site of his previous Dexcom placement.  Nontender to palpation.  No additional bruising seen.  Laboratory evaluation with hemoglobin 11.5 which is only slightly downtrending from baseline at 12.2.  INR is normal, platelet count normal, PTT normal.  BNP concerning with a new BUN elevation to 52 and creatinine 2.37 which is slightly up from a baseline of 1.97 last year.  Patient given fluid resuscitation and urinalysis with no blood and I have overall lower suspicion for obstructive uropathy.  In regards the patient's bruising, it is certainly possible that he is having platelet dysfunction from his underlying renal disease but a true coagulopathy seems unlikely here.  He states that he is having difficulty connecting with a nephrologist [place] in [place] and I help facilitate his care by placing an outpatient referral for [facility].  He was instructed to call his endocrinologist to discuss alternative sites for Dexcom placement but patient currently does not meet inpatient criteria for admission and is safe for discharge with outpatient follow-up.  Patient presentation today appears to be consistent with superficial bruising from Dexcom  placement.   Additional history obtained: -Additional history obtained from wife -External records from outside source obtained and reviewed including: Chart review including previous notes, labs, imaging, consultation notes   Lab Tests: -I ordered, reviewed, and interpreted labs.   The pertinent results include:   Labs Reviewed  CBC - Abnormal; Notable for the following components:      Result Value   RBC 3.84 (*)    Hemoglobin 11.5 (*)    HCT 36.3 (*)    All other components within normal limits  BASIC METABOLIC PANEL - Abnormal; Notable for the following components:   Potassium 5.2 (*)    CO2 20 (*)    Glucose, Bld 121 (*)    BUN 52 (*)    Creatinine, Ser 2.37 (*)  GFR, Estimated 31 (*)    All other components within normal limits  URINALYSIS, ROUTINE W REFLEX MICROSCOPIC - Abnormal; Notable for the following components:   Color, Urine STRAW (*)    Protein, ur 30 (*)    All other components within normal limits  FIBRINOGEN  PROTIME-INR  APTT     Medicines ordered and prescription drug management: Meds ordered this encounter  Medications   lactated ringers bolus 1,000 mL    -I have reviewed the patients home medicines and have made adjustments as needed  Critical interventions none     Cardiac Monitoring: The patient was maintained on a cardiac monitor.  I personally viewed and interpreted the cardiac monitored which showed an underlying rhythm of: NSR  Social Determinants of Health:  Factors impacting patients care include: none   Reevaluation: After the interventions noted above, I reevaluated the patient and found that they have :stayed the same  Co morbidities that complicate the patient evaluation  Past Medical History:  Diagnosis Date   ADD (attention deficit disorder)    Diabetes (HCC)       Dispostion: I considered admission for this patient, but he does not meet inpatient criteria for admission he is safe for discharge with outpatient  follow-up     Final Clinical Impression(s) / ED Diagnoses Final diagnoses:  Bruising  AKI (acute kidney injury) [facility])     @PCDICTATION @    , MD 11/05/22 [phone]

## 2022-11-26 IMAGING — DX DG RIBS W/ CHEST 3+V*L*
3 series · 3 of 3 positions shown · non-contrast
Comparison: None.

CLINICAL DATA: Left-sided pain following fall, initial encounter

EXAM:
LEFT RIBS AND CHEST - 3+ VIEW

[chest pa]
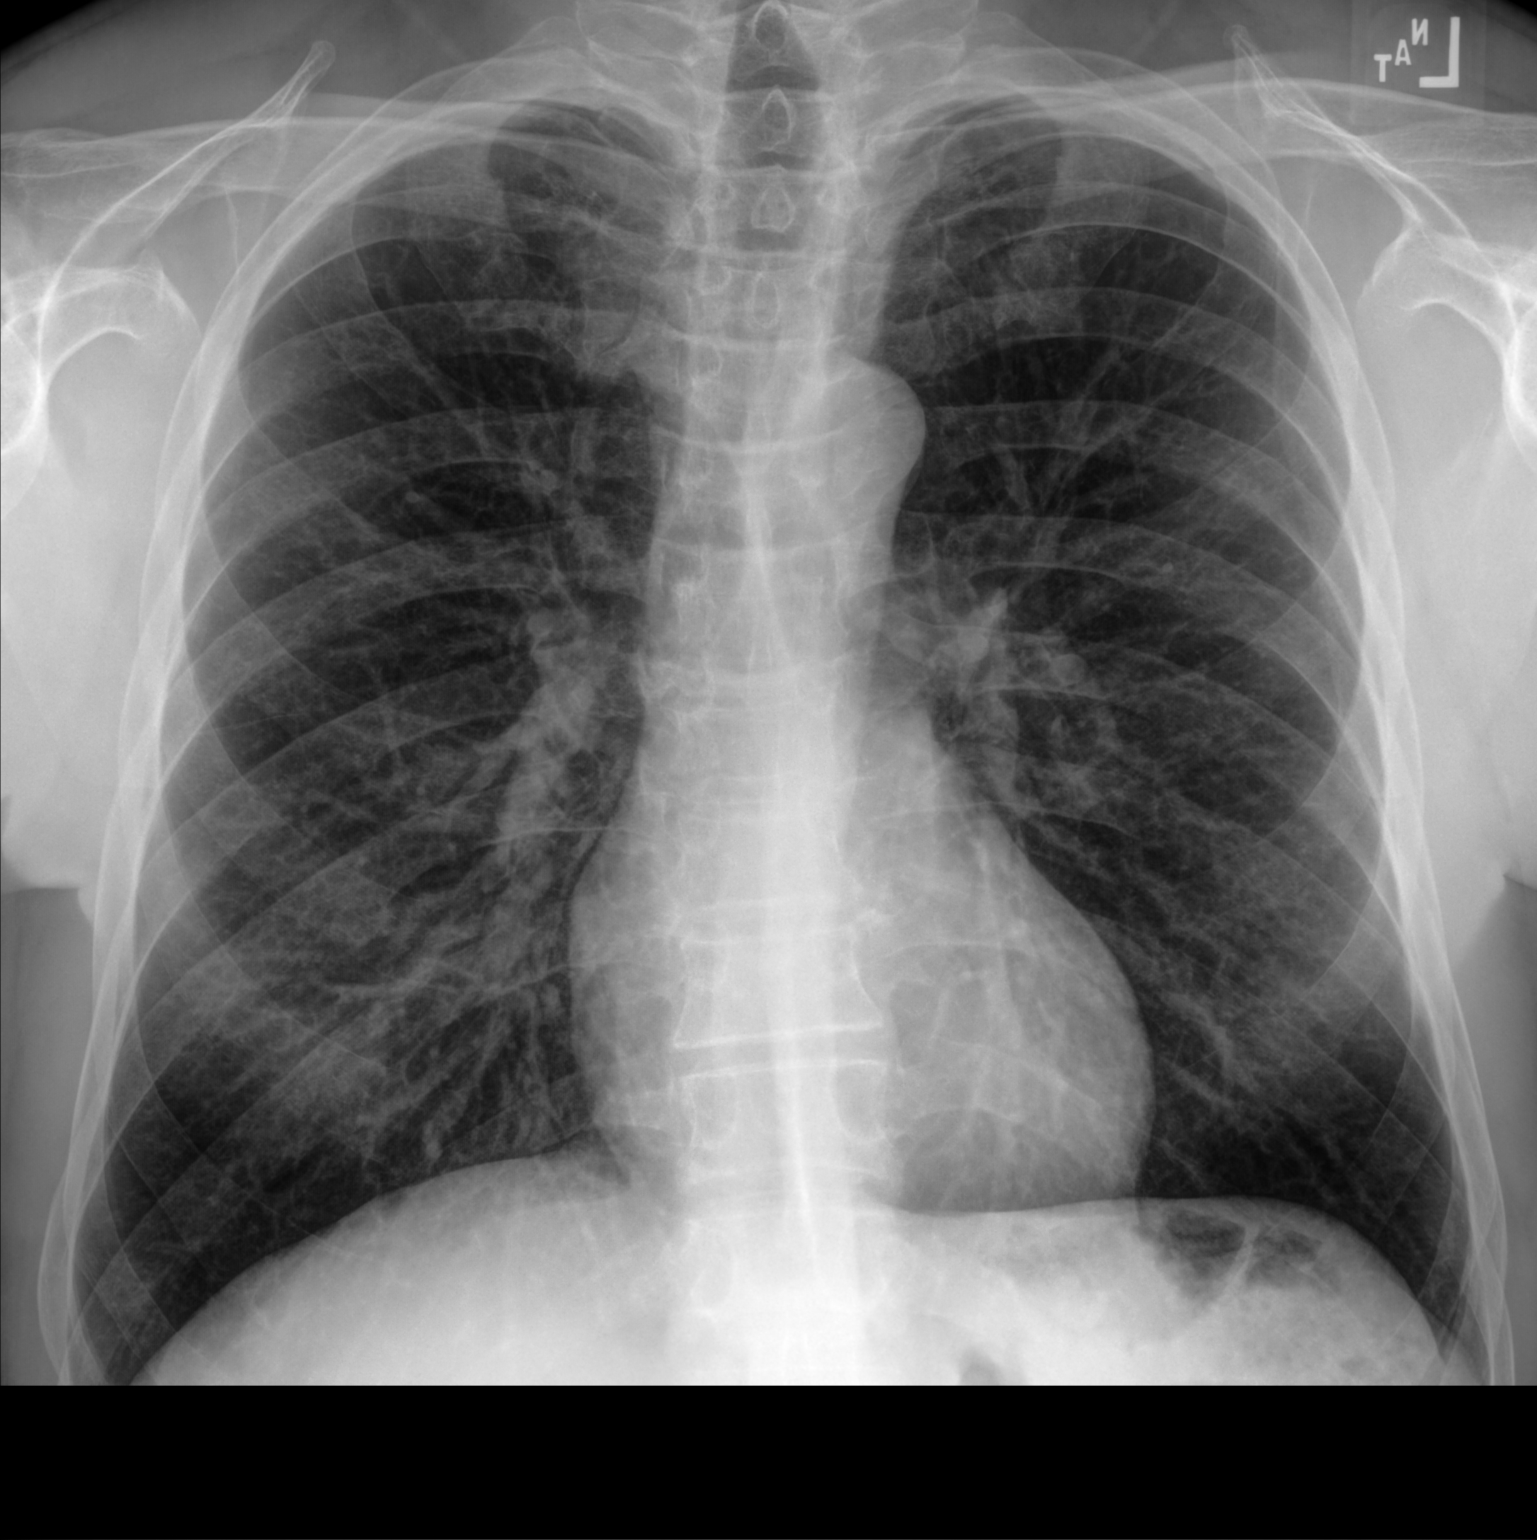

[hemithorax (ribs) ap (1 of 2)]
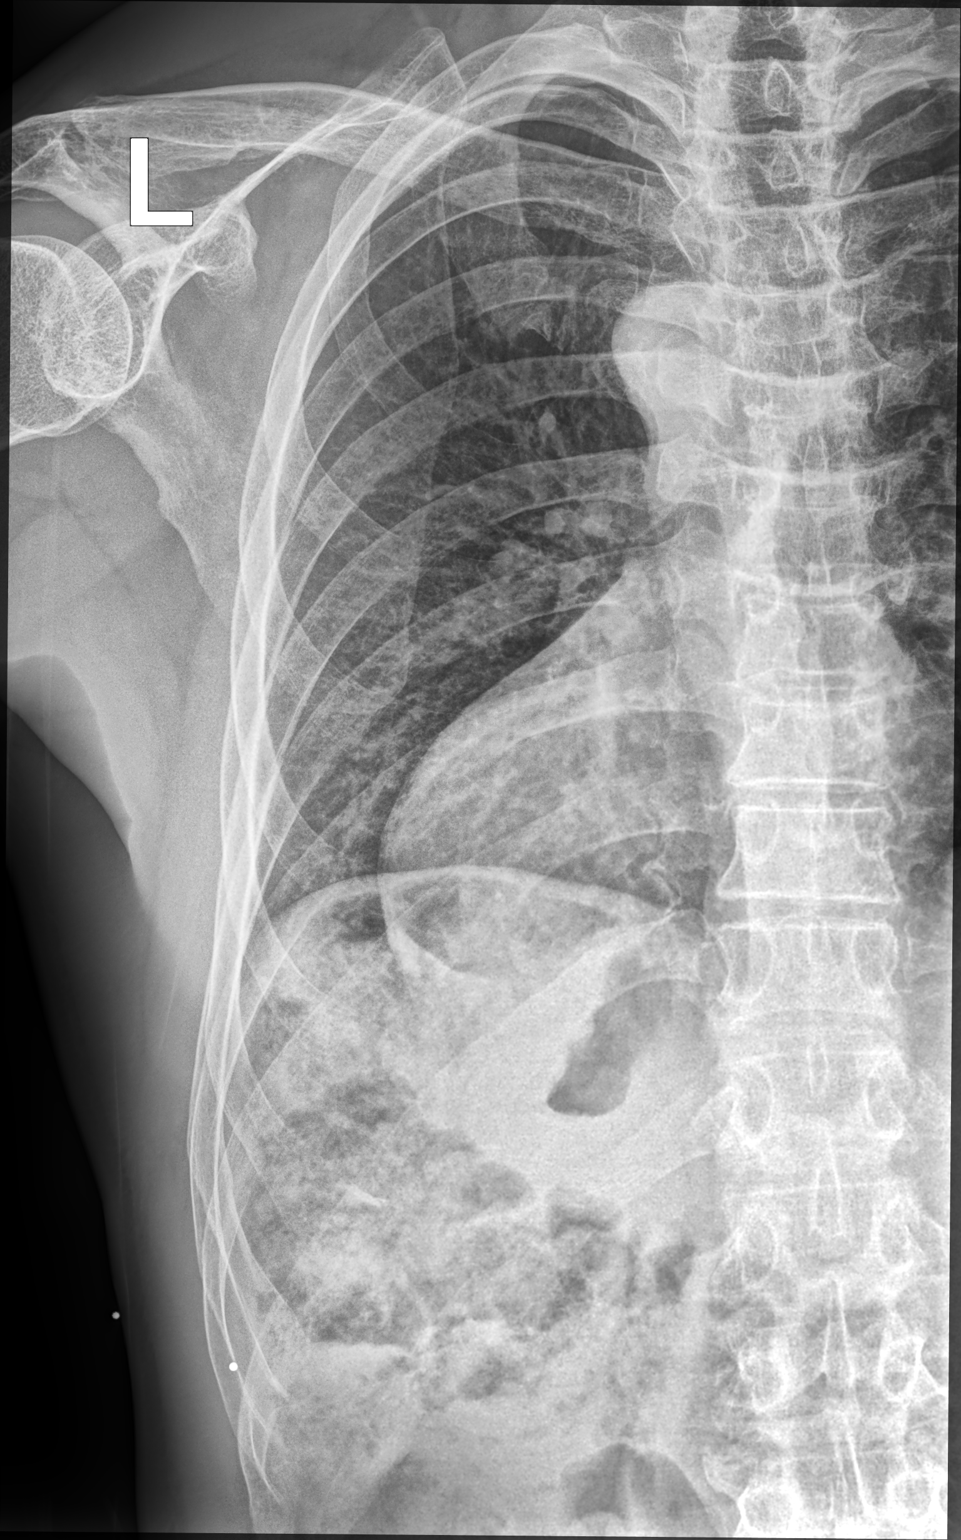

[hemithorax (ribs) ap (2 of 2)]
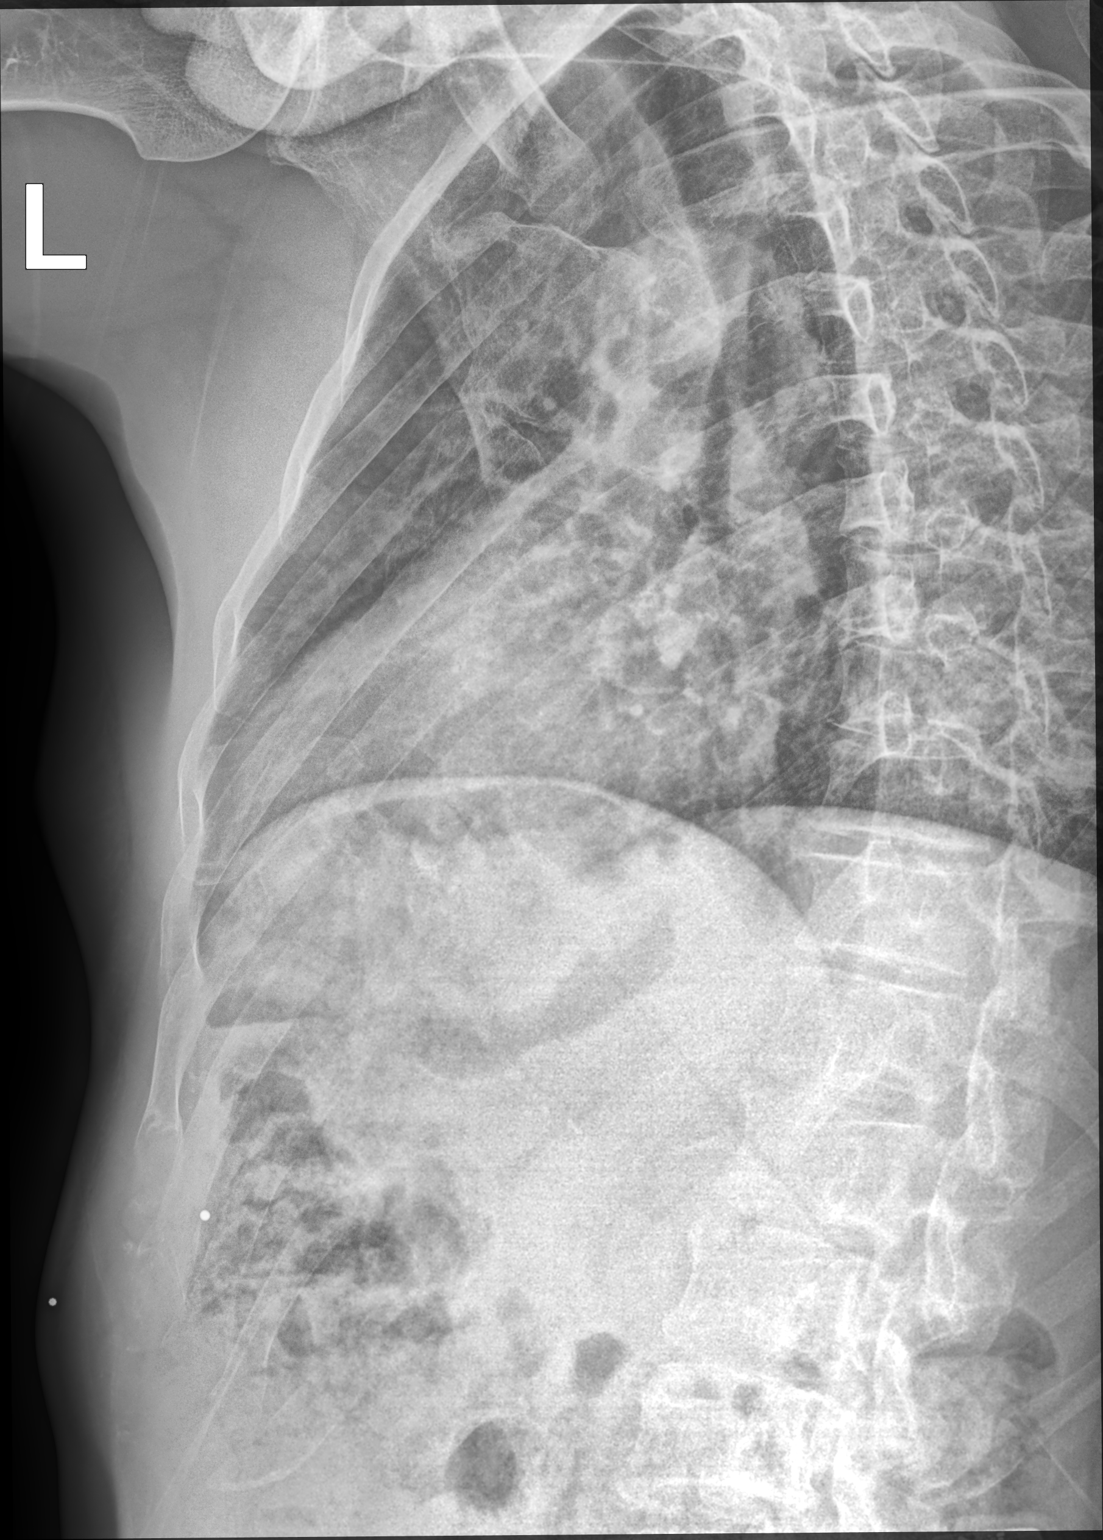

[3 of 3 positions shown; findings below may reference images not displayed]

FINDINGS: Cardiac shadow is within normal limits. The lungs are clear
bilaterally. No pneumothorax is noted. No acute rib fracture is
noted.
IMPRESSION: No acute rib abnormality noted.

## 2022-12-12 ENCOUNTER — Other Ambulatory Visit: Payer: Self-pay | Admitting: Nephrology

## 2022-12-12 DIAGNOSIS — R809 Proteinuria, unspecified: Secondary | ICD-10-CM

## 2022-12-12 DIAGNOSIS — N1832 Chronic kidney disease, stage 3b: Secondary | ICD-10-CM

## 2023-01-02 ENCOUNTER — Ambulatory Visit
Admission: RE | Admit: 2023-01-02 | Discharge: 2023-01-02 | Discharge status: Home / Self Care | Payer: Medicare Other | Source: Ambulatory Visit | Attending: Nephrology

## 2023-01-02 DIAGNOSIS — N1832 Chronic kidney disease, stage 3b: Secondary | ICD-10-CM

## 2023-01-02 DIAGNOSIS — R809 Proteinuria, unspecified: Secondary | ICD-10-CM

## 2023-05-30 ENCOUNTER — Encounter (HOSPITAL_COMMUNITY): Payer: Self-pay

## 2023-05-30 ENCOUNTER — Ambulatory Visit (HOSPITAL_COMMUNITY)
Admission: EM | Admit: 2023-05-30 | Discharge: 2023-05-30 | Disposition: A | Discharge status: Home / Self Care | Payer: Medicare Other | Attending: Emergency Medicine | Admitting: Emergency Medicine

## 2023-05-30 DIAGNOSIS — Z0189 Encounter for other specified special examinations: Secondary | ICD-10-CM

## 2023-05-30 DIAGNOSIS — R42 Dizziness and giddiness: Secondary | ICD-10-CM

## 2023-05-30 LAB — COMPREHENSIVE METABOLIC PANEL
ALT: 19 U/L (ref 0–44)
AST: 19 U/L (ref 15–41)
Albumin: 4.1 g/dL (ref 3.5–5.0)
Alkaline Phosphatase: 77 U/L (ref 38–126)
Anion gap: 7 (ref 5–15)
BUN: 42 mg/dL — ABNORMAL HIGH (ref 8–23)
CO2: 24 mmol/L (ref 22–32)
Calcium: 9.4 mg/dL (ref 8.9–10.3)
Chloride: 104 mmol/L (ref 98–111)
Creatinine, Ser: 2.29 mg/dL — ABNORMAL HIGH (ref 0.61–1.24)
GFR, Estimated: 32 mL/min — ABNORMAL LOW (ref >60)
Glucose, Bld: 144 mg/dL — ABNORMAL HIGH (ref 70–99)
Potassium: 4.4 mmol/L (ref 3.5–5.1)
Sodium: 135 mmol/L (ref 135–145)
Total Bilirubin: 0.6 mg/dL (ref 0.3–1.2)
Total Protein: 7 g/dL (ref 6.5–8.1)

## 2023-05-30 LAB — CBC
HCT: 37.4 % — ABNORMAL LOW (ref 39.0–52.0)
Hemoglobin: 12 g/dL — ABNORMAL LOW (ref 13.0–17.0)
MCH: 29.6 pg (ref 26.0–34.0)
MCHC: 32.1 g/dL (ref 30.0–36.0)
MCV: 92.3 fL (ref 80.0–100.0)
Platelets: 249 10*3/uL (ref 150–400)
RBC: 4.05 MIL/uL — ABNORMAL LOW (ref 4.22–5.81)
RDW: 13.5 % (ref 11.5–15.5)
WBC: 6.1 10*3/uL (ref 4.0–10.5)
nRBC: 0 % (ref 0.0–0.2)

## 2023-05-30 NOTE — ED Triage Notes (Signed)
Pt c/o dizziness since yesterday and worse today. Pt states long hx of dizziness and requesting blood work today.

## 2023-05-30 NOTE — Discharge Instructions (Addendum)
No neurological abnormalities on exam  CMP checked for electrolytes, kidney and liver function as well as CBC checking her blood levels pending, you will be notified of any concerning values  Please schedule follow-up appointment with your  neurologist for further evaluation and management

## 2023-05-30 NOTE — ED Provider Notes (Signed)
[facility]    CSN: [id number] Arrival date & time: 05/30/23  0840      History   Chief Complaint No chief complaint on file.   HPI Spencer Burke is a 62 y.o. male.   Patient presents requesting lab work to assess kidney function.  Endorses that he is chronic dizziness that is worsened over the last 2 days, fluctuating in intensity.  Dizziness feels as if he is off balance causing a heaviness to the eyes and a sensation of fogginess.  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  Using cane at baseline.  Denies lightheadedness, syncope, visual changes, headache, head injury or trauma, chest pain or shortness of breath.    Past Medical History:  Diagnosis Date   ADD (attention deficit disorder)    Diabetes [facility])     Patient Active Problem List   Diagnosis Date Noted   Attention deficit disorder 09/08/2014   Insulin dependent type 1 diabetes mellitus (HCC) 11/04/2010    Past Surgical History:  Procedure Laterality Date   FOOT FRACTURE SURGERY Left 2015   foot and ankle (foot this yr)   MOUTH SURGERY     NASAL SINUS SURGERY  06/2009   REFRACTIVE SURGERY  04/2009   SHOULDER SURGERY Left 09/2009       Home Medications    Prior to Admission medications   Medication Sig Start Date End Date Taking? Authorizing Provider  amphetamine-dextroamphetamine (ADDERALL) 20 MG tablet Take 1 tablet (20 mg total) by mouth 2 (two) times daily. 11/24/19   York Spaniel, MD  aspirin 325 MG tablet Take 325 mg by mouth daily.    [provider]  atorvastatin (LIPITOR) 80 MG tablet Take 80 mg by mouth daily.    [provider]  glucose blood test strip CHECK BLOOD SUGAR 10-12 TIMES DAILY 04/05/15   [provider]  insulin aspart (NOVOLOG) 100 UNIT/ML injection Inject into the skin 3 (three) times daily before meals. Use as directed for insulin pump.    [provider]  Insulin Human (INSULIN  PUMP) SOLN by Intravenous (Continuous Infusion) route. 5 units, Basal rate continous    [provider]  lisinopril (PRINIVIL,ZESTRIL) 10 MG tablet TAKE ONE TABLET BY MOUTH DAILY (pt stated he is tritrating back up ot 10mg , taking 5mg  now) 01/04/15   [provider]  loperamide (IMODIUM) 2 MG capsule Take 1 capsule (2 mg total) by mouth 4 (four) times daily as needed for diarrhea or loose stools. 01/19/21   Roxy Horseman, PA-C  loratadine (CLARITIN) 10 MG tablet Take 5 mg by mouth as needed.    [provider]  naproxen (NAPROSYN) 500 MG tablet Take 1 tablet (500 mg total) by mouth 2 (two) times daily with a meal. 11/19/20   Wallis Bamberg, PA-C  tiZANidine (ZANAFLEX) 4 MG tablet Take 1 tablet (4 mg total) by mouth every 6 (six) hours as needed for muscle spasms. 11/19/20   Wallis Bamberg, PA-C    Family History Family History  Problem Relation Age of Onset   Transient ischemic attack Mother    Transient ischemic attack Father     Social History Social History   Tobacco Use   Smoking status: Never   Smokeless tobacco: Never  Substance Use Topics   Alcohol use: No   Drug use: No     Allergies   Tetracyclines & related   Review of Systems Review of Systems  Constitutional: Negative.   HENT: Negative.    Respiratory: Negative.    Cardiovascular: Negative.   Neurological:  Positive for dizziness. Negative for tremors, seizures, syncope, facial asymmetry, speech difficulty, weakness, light-headedness, numbness and headaches.     Physical Exam Triage Vital Signs ED Triage Vitals  Enc Vitals Group     BP      Pulse      Resp      Temp      Temp src      SpO2      Weight      Height      Head Circumference      Peak Flow      Pain Score      Pain Loc      Pain Edu?      Excl. in GC?    No data found.  Updated Vital Signs There were no vitals taken for this visit.  Visual Acuity Right Eye Distance:   Left Eye Distance:   Bilateral  Distance:    Right Eye Near:   Left Eye Near:    Bilateral Near:     Physical Exam Constitutional:      Appearance: Normal appearance.  Eyes:     Extraocular Movements: Extraocular movements intact.     Conjunctiva/sclera: Conjunctivae normal.     Pupils: Pupils are equal, round, and reactive to light.  Pulmonary:     Effort: Pulmonary effort is normal.  Neurological:     General: No focal deficit present.     Mental Status: He is alert and oriented to person, place, and time. Mental status is at baseline.     Cranial Nerves: No cranial nerve deficit.     Motor: No weakness.     Gait: Gait normal.      UC Treatments / Results  Labs (all labs ordered are listed, but only abnormal results are displayed) Labs Reviewed  COMPREHENSIVE METABOLIC PANEL  CBC    EKG   Radiology No results found.  Procedures Procedures (including critical care time)  Medications Ordered in UC Medications - No data to display  Initial Impression / Assessment and Plan / UC Course  I have reviewed the triage vital signs and the nursing notes.  Pertinent labs & imaging results that were available during my care of the patient were reviewed by me and considered in my medical decision making (see chart for details).  Dizziness, encounter for laboratory testing  Vitals  are stable patient is in no signs of distress nontoxic-appearing, no abdominal or logical abnormalities noted on exam, stable for outpatient management, patient heading to the gym after the appointment.  CMP and CBC obtained, will follow-up with his neurologist in 1 to 2 weeks, given strict precautions for worsening symptoms to go to the nearest emergency department for immediate evaluation, verbalized understanding Final Clinical Impressions(s) / UC Diagnoses   Final diagnoses:  None   Discharge Instructions   None    ED Prescriptions   None    PDMP not reviewed this encounter.   Valinda Hoar, [place] 05/30/23  [phone]

## 2023-06-21 ENCOUNTER — Encounter: Payer: Self-pay | Admitting: Dietician

## 2023-06-21 ENCOUNTER — Encounter: Payer: Medicare Other | Attending: Nephrology | Admitting: Dietician

## 2023-06-21 VITALS — Ht 69.0 in | Wt 179.0 lb

## 2023-06-21 DIAGNOSIS — E109 Type 1 diabetes mellitus without complications: Secondary | ICD-10-CM | POA: Insufficient documentation

## 2023-06-21 NOTE — Progress Notes (Signed)
   Diabetes Self-Management Education  Visit Type: First/Initial  Appt. Start Time: 1025 Appt. End Time: 1115  06/22/2023  [name], identified by name and date of birth, is a 62 y.o. male with a diagnosis of Diabetes: Type 1.   ASSESSMENT Patient is here today with his partner [name] He is here today due to worsening kidney function. Sodium bicarb due to metabolic acidosis and is also trying to follow a low potassium diet. Falls due to intermittent balance issues and weakness. Increased constipation with diet changes and other medications.  History includes:  Type 1 Diabetes since age 15, pontene stoke, HTN, ADD Medications include:  Novolog in pump,vitmain B-12, losartan, Vitamin  B-1, folic acid, Vitamin D3, pravastatin, 650 mg sodium bicarb bid Labs noted to include:  BUN 42, Creatinine 2.29, Potassium 4.4, eGFR 32 A1C 6.7% 05/18/2023 and 6.6% 02/13/2023   Insulin pump:  Tandem CGM:  Dexcom G6  Weight hx: 69" 179 lbs 06/21/2023 States that his weight is higher than it has every been  UBW 165   Patient lives with [place].  She does the shopping and cooking. He is on disability.  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 Exercises 5-7 days per week - 120 minutes (PT, aerobic) Stops pump for exercise. [name] does not think he is eating enough to sustain his energy.  Height 5\' 9"  (1.753 m), weight 179 lb (81.2 kg). Body mass index is 26.43 kg/m.   Diabetes Self-Management Education - 06/21/23 1118       Visit Information   Visit Type First/Initial      Initial Visit   Diabetes Type Type 1    Date Diagnosed 1966    Are you currently following a meal plan? Yes    What type of meal plan do you follow? carb counting      Dietary Intake   Breakfast blueberries, walnuts, eggs, grits or toast (sometimes with tahini) OR 1/2 cup coconut milk, coffee, 1/4 cup milk, blueberries, walnuts    Lunch 2 slices toast with tahini and jelly (low blood  glucose)    Snack (afternoon) home made slaw (napa salad, raman noodles, almonds, onions, sesame seeds, dijon dressing    Dinner kabob's, rice, grape leaves, tahini, cheesecake, out to eat    Beverage(s) water, occasional 4 oz wine             Individualized Plan for Diabetes Self-Management Training:   Learning Objective:  Patient will have a greater understanding of diabetes self-management. Patient education plan is to attend individual and/or group sessions per assessed needs and concerns.   Plan:   There are no Patient Instructions on file for this visit.  Expected Outcomes:     Education material provided: CKD stages 3-5 Nutrition Therapy from AND, NKD national kidney diet - Dish up a Kidney-Friendly Meal for Patients with Chronic Kidney Disease (not on dialysis)  If problems or questions, patient to contact team via:  Phone  Future DSME appointment:  prn

## 2023-06-22 NOTE — Patient Instructions (Signed)
Continue to stay active. Choose whole grains. Low potassium choices. Smaller portions of meat and choose plant proteins more frequently. Avoid red meat AVoid foods with Phos... in the ingredient list.

## 2023-08-24 ENCOUNTER — Encounter: Payer: Medicare Other | Attending: Nephrology | Admitting: Dietician

## 2023-08-24 ENCOUNTER — Encounter: Payer: Self-pay | Admitting: Dietician

## 2023-08-24 DIAGNOSIS — E109 Type 1 diabetes mellitus without complications: Secondary | ICD-10-CM | POA: Diagnosis present

## 2023-08-24 DIAGNOSIS — Z713 Dietary counseling and surveillance: Secondary | ICD-10-CM | POA: Diagnosis not present

## 2023-08-24 NOTE — Patient Instructions (Signed)
Balanced meal plan - see meal plan  Snack ideas:  Granola bar (without chocolate)  1/4 cup nuts, dried cranberries  Please call or MyChart for questions

## 2023-08-24 NOTE — Progress Notes (Unsigned)
Diabetes Self-Management Education  Visit Type: Follow-up  Appt. Start Time: 1000 Appt. End Time: 1030  08/29/2023  [name], identified by name and date of birth, is a 62 y.o. male with a diagnosis of Diabetes: Type 1.   ASSESSMENT Patient is here today alone.  He was last seen on 06/21/2023.  Patient states that he needs more assistance with meal planning. He is very restrictive in his eating. He states that his potassium has increased despite a low potassium diet.  He has started on [facility] for this. Now off pravastatin and has increased strength in his legs. Continues to be very physically active.  History includes:  Type 1 Diabetes since age 4, pontene stoke, HTN, ADD Medications include:  Novolog in pump,vitmain B-12, losartan, Vitamin  B-1, folic acid, Vitamin D3, pravastatin, 650 mg sodium bicarb bid Labs noted to include:  BUN 37, Creatinine 2.46, Potassium 5.5, eGFR 29 on 08/17/2023 A1C 6.6% 08/17/2023 stable Insulin pump:  Tandem CGM:  Dexcom G6   Weight hx: 69" 180 lbs 08/24/2023 179 lbs 06/21/2023 States that his weight is higher than it has every been  UBW 165    Patient lives with Kermit.  She does the shopping and cooking. He is on disability.  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 Exercises 5-7 days per week - 120 minutes (PT, aerobic) Stops pump for exercise. [name] does not think he is eating enough to sustain his energy.    Diabetes Self-Management Education - 08/29/23 1600       Visit Information   Visit Type Follow-up      Initial Visit   Diabetes Type Type 1    Are you currently following a meal plan? Yes    What type of meal plan do you follow? carb counting    Are you taking your medications as prescribed? Yes      Psychosocial Assessment   Patient Belief/Attitude about Diabetes Motivated to manage diabetes    What is the hardest part about your diabetes right now, causing you the most concern, or  is the most worrisome to you about your diabetes?   Making healty food and beverage choices    Self-care barriers None    Self-management support Doctor's office;Family;CDE visits    Other persons present Patient    Patient Concerns Nutrition/Meal planning    Special Needs None    Preferred Learning Style No preference indicated    Learning Readiness Ready      Pre-Education Assessment   Patient understands the diabetes disease and treatment process. Demonstrates understanding / competency    Patient understands incorporating nutritional management into lifestyle. Needs Review    Patient undertands incorporating physical activity into lifestyle. Demonstrates understanding / competency    Patient understands using medications safely. Demonstrates understanding / competency    Patient understands monitoring blood glucose, interpreting and using results Demonstrates understanding / competency    Patient understands prevention, detection, and treatment of acute complications. Demonstrates understanding / competency    Patient understands prevention, detection, and treatment of chronic complications. Demonstrates understanding / competency    Patient understands how to develop strategies to address psychosocial issues. Demonstrates understanding / competency    Patient understands how to develop strategies to promote health/change behavior. Needs Review      Complications   Last HgB A1C per patient/outside source 6.6 % (P)    6.6% 08/17/2023   How often do you check your blood  sugar? > 4 times/day (P)       Subsequent Visit   Since your last visit have you continued or begun to take your medications as prescribed? Yes    Since your last visit have you experienced any weight changes? No change    Since your last visit, are you checking your blood glucose at least once a day? Yes             Individualized Plan for Diabetes Self-Management Training:   Learning Objective:  Patient will  have a greater understanding of diabetes self-management. Patient education plan is to attend individual and/or group sessions per assessed needs and concerns.   Plan:   Patient Instructions  Balanced meal plan - see meal plan  Snack ideas:  Granola bar (without chocolate)  1/4 cup nuts, dried cranberries  Please call or MyChart for questions  Expected Outcomes:     Education material provided: Copies from Choose a Meal Book  If problems or questions, patient to contact team via:  Phone  Future DSME appointment:

## 2023-09-20 ENCOUNTER — Ambulatory Visit: Payer: PRIVATE HEALTH INSURANCE | Admitting: Dietician

## 2023-09-27 ENCOUNTER — Encounter: Payer: Medicare Other | Attending: Nephrology | Admitting: Dietician

## 2023-09-27 ENCOUNTER — Encounter: Payer: Self-pay | Admitting: Dietician

## 2023-09-27 DIAGNOSIS — E109 Type 1 diabetes mellitus without complications: Secondary | ICD-10-CM | POA: Insufficient documentation

## 2023-09-27 DIAGNOSIS — N1832 Chronic kidney disease, stage 3b: Secondary | ICD-10-CM | POA: Insufficient documentation

## 2023-09-27 NOTE — Patient Instructions (Signed)
Choose protein with each meal:  Egg whites  Tofu  Beans  Peanut butter or almond butter  Adequate starches to help maintain your weight and energy on an adequate lifestyle. Low potassium vegetables and fruit.  No need to count potassium.  Avoid potassium that is added to your foods.  Check your food labels closely.

## 2023-09-27 NOTE — Progress Notes (Signed)
   Diabetes Self-Management Education  Visit Type: Follow-up  Appt. Start Time: 0805 Appt. End Time: 0950  09/27/2023  [name], identified by name and date of birth, is a 62 y.o. male with a diagnosis of Diabetes: Type 1.   ASSESSMENT Patient is here today alone.  He was last seen by this RD on 08/24/2023.  He is drinking plenty of water. Continues a low sodium, low potassium, low phos, lower protein diet. He states that he is doing pretty well but would like more information about meeting his calorie needs with increased restrictions in his diet. He continues to exercise significantly and consistently.  History includes:  Type 1 Diabetes since age 75, pontene stoke, HTN, ADD Medications include:  Novolog in pump,vitmain B-12, losartan, Vitamin  B-1, folic acid, Vitamin D3, 650 mg sodium bicarb bid, Lokalma 10- mg every other day for low potassium  Labs noted to include:  BUN 37, Creatinine 2.46, Potassium 5.5, eGFR 29 on 08/17/2023 A1C 6.6% 08/17/2023 stable Insulin pump:  Tandem CGM:  Dexcom G6   Weight hx: 69" 175 lbs 09/27/2023 180 lbs 08/24/2023 179 lbs 06/21/2023 States that his weight is higher than it has every been  UBW 165   Estimated needs: >2300 calories daily 65-75 grams protein daily   Patient lives with Spencer Burke.  She does the shopping and cooking. He is on disability.  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 Exercises 5-7 days per week - 120 minutes (PT, aerobic) Stops pump for exercise. Spencer Burke does not think he is eating enough to sustain his energy.    Diabetes Self-Management Education - 09/27/23 1700       Visit Information   Visit Type Follow-up      Initial Visit   Diabetes Type Type 1    What type of meal plan do you follow? carb counting    Are you taking your medications as prescribed? Yes      Subsequent Visit   Since your last visit have you continued or begun to take your medications as prescribed?  Yes             Individualized Plan for Diabetes Self-Management Training:   Learning Objective:  Patient will have a greater understanding of diabetes self-management. Patient education plan is to attend individual and/or group sessions per assessed needs and concerns.   Plan:   Patient Instructions  Choose protein with each meal:  Egg whites  Tofu  Beans  Peanut butter or almond butter  Adequate starches to help maintain your weight and energy on an adequate lifestyle. Low potassium vegetables and fruit.  No need to count potassium.  Avoid potassium that is added to your foods.  Check your food labels closely.     Expected Outcomes:   good  Education material provided:   If problems or questions, patient to contact team via:  Phone  Future DSME appointment:  3 months

## 2023-11-23 ENCOUNTER — Encounter: Payer: Self-pay | Admitting: Dietician

## 2023-11-23 ENCOUNTER — Encounter: Payer: Medicare Other | Attending: Nephrology | Admitting: Dietician

## 2023-11-23 DIAGNOSIS — E109 Type 1 diabetes mellitus without complications: Secondary | ICD-10-CM | POA: Insufficient documentation

## 2023-11-23 NOTE — Progress Notes (Unsigned)
Diabetes Self-Management Education  Visit Type: Follow-up  Appt. Start Time: 1315 Appt. End Time: 1345  11/27/2023  [name], identified by name and date of birth, is a 61 y.o. male with a diagnosis of Diabetes: Type 1.   ASSESSMENT Patient was here today alone.  He was lasts seen by this RD on 09/27/2023.  Labe works sent to my email per patient - reviewed. Mostly vegetarian diet and eating a lot of soy.  Occasional chicken. Not lifing weights but is hiking.  States he will return to the gym soon due to winter weather. Severe lows at times and takes increased amounts of carbohydrates to return to normal.   Discussed increased insulin sensitivity that can occur on a plant based diet.   Eating more potassium rich foods.  Continues the [place]. Labs from patient reviewed with improvements of labs. Increased itching of the skin.  Started about 8 months ago and now worse. Podiatrist gave him a cream to see if this helps.  History includes:  Type 1 Diabetes since age 86, pontene stoke, HTN, ADD Medications include:  Novolog in pump,vitmain B-12, losartan, Vitamin  B-1, folic acid, Vitamin D3, Lokalma 10- mg every other day for low potassium  Continues a low sodium diet.  Labs noted to include:  BUN 39, Creatinine 2.36, Potassium 54.9, eGFR 31 on 10/04/2023 A1C 6.6% 11/15/2023 stable Insulin pump:  Tandem CGM:  Dexcom G6   Weight hx: 69" 175 lbs 11/24/2023 175 lbs 09/27/2023 180 lbs 08/24/2023 179 lbs 06/21/2023 States that his weight is higher than it has every been  UBW 165    Estimated needs: >2300 calories daily 65-75 grams protein daily   Patient lives with Spencer Burke.  She does the shopping and cooking. He is on disability.  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 Exercises 5-7 days per week - 120 minutes (PT, aerobic) Stops pump for exercise. [name] does not think he is eating enough to sustain his energy.    Diabetes  Self-Management Education - 11/27/23 1100       Visit Information   Visit Type Follow-up      Initial Visit   Diabetes Type Type 1    What type of meal plan do you follow? carb counting    Are you taking your medications as prescribed? Yes      Psychosocial Assessment   Patient Belief/Attitude about Diabetes Motivated to manage diabetes    What is the hardest part about your diabetes right now, causing you the most concern, or is the most worrisome to you about your diabetes?   Making healty food and beverage choices;Getting support / problem solving    Self-care barriers None    Self-management support Doctor's office    Other persons present Patient    Patient Concerns Nutrition/Meal planning    Special Needs None    Preferred Learning Style No preference indicated    Learning Readiness Ready    How often do you need to have someone help you when you read instructions, pamphlets, or other written materials from your doctor or pharmacy? 1 - Never      Pre-Education Assessment   Patient understands the diabetes disease and treatment process. Demonstrates understanding / competency    Patient understands incorporating nutritional management into lifestyle. Needs Review    Patient undertands incorporating physical activity into lifestyle. Demonstrates understanding / competency    Patient understands using medications safely. Demonstrates understanding / competency  Patient understands monitoring blood glucose, interpreting and using results Demonstrates understanding / competency    Patient understands prevention, detection, and treatment of acute complications. Demonstrates understanding / competency    Patient understands prevention, detection, and treatment of chronic complications. Demonstrates understanding / competency    Patient understands how to develop strategies to address psychosocial issues. Demonstrates understanding / competency    Patient understands how to develop  strategies to promote health/change behavior. Needs Review      Complications   Last HgB A1C per patient/outside source 6.6 %   11/15/2023   How often do you check your blood sugar? > 4 times/day      Dietary Intake   Breakfast soy milk/macadamian or almond milk, occasiona small amount of maple syrp and small amount of coffee, 1 cup oatmeal or Ezekiel bread with tahini, berries    Snack (morning) tahini sandiwch with Ezekiel bread or [employer] baklava for snack when hiking    Lunch ezekiel bread, tahini, apple, berries, water crackers, occasional nuts, rare tahini cookie OR peas, cauliflower, brown rice, other vegetables    Dinner salad with brown rice/cauliflower, corn, rice, black beans, onions, arugula, balsamic/olive oil, bread, occasional chicken OR rice noodles, vegetables, tofu (occasional) slaw    Beverage(s) water, juice to treat a low      Activity / Exercise   Activity / Exercise Type Moderate (swimming / aerobic walking)    How many days per week do you exercise? 6    How many minutes per day do you exercise? 75    Total minutes per week of exercise 450      Patient Education   Previous Diabetes Education Yes (please comment)   09/2023   Disease Pathophysiology Other (comment)   insulin sensitivity   Healthy Eating Meal options for control of blood glucose level and chronic complications.    Being Active Role of exercise on diabetes management, blood pressure control and cardiac health.    Medications Reviewed patients medication for diabetes, action, purpose, timing of dose and side effects.    Monitoring Taught/evaluated CGM (comment)    Acute complications Taught prevention, symptoms, and  treatment of hypoglycemia - the 15 rule.    Chronic complications Identified and discussed with patient  current chronic complications    Diabetes Stress and Support Identified and addressed patients feelings and concerns about diabetes;Worked with patient to identify barriers to care  and solutions      Individualized Goals (developed by patient)   Nutrition Carb counting    Physical Activity Exercise 5-7 days per week;60 minutes per day    Medications take my medication as prescribed    Monitoring  Consistenly use CGM    Problem Solving Eating Pattern    Reducing Risk examine blood glucose patterns      Patient Self-Evaluation of Goals - Patient rates self as meeting previously set goals (% of time)   Nutrition >75% (most of the time)    Physical Activity >75% (most of the time)    Medications >75% (most of the time)    Monitoring >75% (most of the time)    Problem Solving and behavior change strategies  >75% (most of the time)    Reducing Risk (treating acute and chronic complications) >75% (most of the time)    Health Coping >75% (most of the time)      Post-Education Assessment   Patient understands the diabetes disease and treatment process. Demonstrates understanding / competency    Patient understands  incorporating nutritional management into lifestyle. Needs Review    Patient undertands incorporating physical activity into lifestyle. Demonstrates understanding / competency    Patient understands using medications safely. Demonstrates understanding / competency    Patient understands monitoring blood glucose, interpreting and using results Demonstrates understanding / competency    Patient understands prevention, detection, and treatment of acute complications. Demonstrates understanding / competency    Patient understands prevention, detection, and treatment of chronic complications. Demonstrates understanding / competency    Patient understands how to develop strategies to address psychosocial issues. Demonstrates understanding / competency    Patient understands how to develop strategies to promote health/change behavior. Demonstrates understanding / competency      Outcomes   Expected Outcomes Demonstrated interest in learning. Expect positive outcomes     Future DMSE PRN    Program Status Completed      Subsequent Visit   Since your last visit have you continued or begun to take your medications as prescribed? Yes    Since your last visit have you experienced any weight changes? No change    Since your last visit, are you checking your blood glucose at least once a day? Yes             Individualized Plan for Diabetes Self-Management Training:   Learning Objective:  Patient will have a greater understanding of diabetes self-management. Patient education plan is to attend individual and/or group sessions per assessed needs and concerns.   Plan:   There are no Patient Instructions on file for this visit.  Expected Outcomes:  Demonstrated interest in learning. Expect positive outcomes  Education material provided:   If problems or questions, patient to contact team via:  Phone  Future DSME appointment: PRN

## 2023-12-12 ENCOUNTER — Ambulatory Visit: Payer: Medicare Other | Attending: Cardiology | Admitting: Cardiology

## 2023-12-12 ENCOUNTER — Encounter: Payer: Self-pay | Admitting: Cardiology

## 2023-12-12 VITALS — HR 62 | Ht 69.0 in | Wt 176.0 lb

## 2023-12-12 DIAGNOSIS — N1832 Chronic kidney disease, stage 3b: Secondary | ICD-10-CM | POA: Diagnosis present

## 2023-12-12 DIAGNOSIS — E109 Type 1 diabetes mellitus without complications: Secondary | ICD-10-CM | POA: Diagnosis present

## 2023-12-12 DIAGNOSIS — R0789 Other chest pain: Secondary | ICD-10-CM | POA: Diagnosis present

## 2023-12-12 DIAGNOSIS — R0989 Other specified symptoms and signs involving the circulatory and respiratory systems: Secondary | ICD-10-CM | POA: Diagnosis present

## 2023-12-12 DIAGNOSIS — Z8673 Personal history of transient ischemic attack (TIA), and cerebral infarction without residual deficits: Secondary | ICD-10-CM | POA: Diagnosis present

## 2023-12-12 DIAGNOSIS — E1069 Type 1 diabetes mellitus with other specified complication: Secondary | ICD-10-CM | POA: Diagnosis present

## 2023-12-12 DIAGNOSIS — I10 Essential (primary) hypertension: Secondary | ICD-10-CM | POA: Diagnosis present

## 2023-12-12 DIAGNOSIS — E785 Hyperlipidemia, unspecified: Secondary | ICD-10-CM | POA: Diagnosis present

## 2023-12-12 HISTORY — DX: Essential (primary) hypertension: I10

## 2023-12-12 NOTE — Progress Notes (Signed)
 Cardiology Office Note:  .   Date:  12/15/2023  ID:  Spencer Burke, DOB May 19, 1961, MRN [id number] PCP: Burke Coy, MD  [facility] Cardiologist:  Spencer Clay, MD; new Nephrologist: Dr. Dolan Burke [facility]) Endocrinologist: Spencer Clide, MD Chief Complaint  Patient presents with   New Patient (Initial Visit)    Significant cardiac risk factors-history of chest pain. Father is a patient of Dr. Clay    Patient Profile: .     Spencer Burke is a 63 y.o. male non-smoker with a PMH notable for longstanding DM-2 (with Stage IIIb-IV CKD last creatinine level 2.23), polyneuropathy and proliferative retinopathy), history of CVA (with resultant attention deficit disorder), and family history of CAD (father, Spencer Burke currently 68 had PTCA over 30 years ago) who presents here for Baseline Cardiovascular Evaluation with a History of Chest Pain at the request of Alto Portal, MD.    Spencer Burke was seen by Dr. Alto on October 04, 2023 noting about a 1 month of left-sided chest pain described as a tightness.  It was a intermittent, but not necessarily associated with activity.  He was concerned about his blood sugar levels.  Based on his risk factors of family history, he is referred for cardiology evaluation.  Has not had any more chest pain since then.  He was evaluated with a VQ scan which showed no evidence of PE.  Subjective  Discussed the use of AI scribe software for clinical note transcription with the patient, who gave verbal consent to proceed.  History of Present Illness   The patient, a 63 year old with a history of type 1 diabetes, kidney disease, and a pontine stroke in 2020, presents for evaluation of chest discomfort experienced a few months ago. The discomfort, described as a tight sensation, was located in the chest and seemed to radiate through to the back. It was not associated with exertion and did not worsen with activity. The discomfort was persistent  for a couple of months but has since resolved. The patient denies any associated shortness of breath, palpitations, or exacerbation of symptoms with lying down.  The patient also reports a significant improvement in exercise tolerance and muscle strength since discontinuing pravastatin three months ago. Prior to discontinuation, the patient experienced muscle weakness and balance issues, which he initially attributed to the effects of the pontine stroke. The patient describes a dramatic change in his ability to exercise since stopping the medication, although he still experiences episodes of weakness and balance issues.  The patient has been managing his type 1 diabetes and kidney disease with a low sodium, low potassium, and low phosphorous diet. He has been on disability for over a year due to the combined effects of his medical conditions. Despite these challenges, the patient remains motivated to stay healthy and continues to engage in physical activity as tolerated.  The patient also reports episodes of weakness and difficulty coordinating movements, particularly after exertion. These episodes are described as episodic and not occurring day after day. The patient denies any associated chest pain, palpitations, or shortness of breath during these episodes. He also denies any symptoms suggestive of heart failure, such as orthopnea or leg swelling. The patient is currently on a low dose of aspirin for stroke prevention.     Due to reduced overall activity level, has not been having significant chest pain or pressure at rest exertion.  Over, his exercise level has picked up since stopping statin.  No heart failure symptoms of  PND, orthopnea or edema.  No rapid irregular heartbeats or palpitations.  No syncope or near syncope or recurrent TIA/amaurosis fugax symptoms.  He does have peripheral neuropathy symptoms as well as visual issues with his diabetes.  ROS:  Review of Systems - Negative except noted  above.  Somewhat unsteady gait/ataxia from peripheral neuropathy.  Also vision issues.) retinopathy.  Some attention disorder issues but relatively controlled.    Objective   Past Medical History:  Diagnosis Date   ADD (attention deficit disorder)    Cervical stenosis of spine    DM (diabetes mellitus) type I controlled with renal manifestation (HCC)    Diagnosed at age 66: Complicated by CKD IIIb-IV, bilateral proliferative retinopathy and polyneuropathy; proteinuria   Essential hypertension 12/12/2023   Hyperlipidemia due to type 1 diabetes mellitus (HCC)    Right pontine stroke (HCC) 09/2019   Family History  Problem Relation Age of Onset   Transient ischemic attack Mother    Transient ischemic attack Father    Coronary artery disease Father 26       Followed by Dr. Anner.  PTCA in the 1990s   Current Meds  Medication Sig   aspirin 325 MG tablet Take 325 mg by mouth daily.   Cyanocobalamin 500 MCG SUBL Place under the tongue.   folic acid (FOLVITE) 1 MG tablet Take 1 mg by mouth daily.   Glucagon (GVOKE HYPOPEN 2-PACK) 1 MG/0.2ML SOAJ Use per package direction in the event of extremely low blood sugar   glucose blood test strip CHECK BLOOD SUGAR 10-12 TIMES DAILY   insulin aspart (NOVOLOG) 100 UNIT/ML injection Inject into the skin 3 (three) times daily before meals. Use as directed for insulin pump.   Insulin Human (INSULIN PUMP) SOLN by Intravenous (Continuous Infusion) route. 5 units, Basal rate continous   LANTUS 100 UNIT/ML injection    losartan (COZAAR) 25 MG tablet Take 1 tablet by mouth daily.   sodium zirconium cyclosilicate (LOKELMA) 10 g PACK packet Take 10 g by mouth.   thiamine (VITAMIN B1) 100 MG tablet Take by mouth.    Studies Reviewed: SABRA   EKG Interpretation Date/Time:  Wednesday December 12 2023 14:07:44 EST Ventricular Rate:  62 PR Interval:  150 QRS Duration:  88 QT Interval:  404 QTC Calculation: 410 R Axis:   64  Text Interpretation: Normal  sinus rhythm Normal ECG No previous ECGs available Confirmed by Anner Lenis ([id number]) on 12/15/2023 6:56:48 PM    ECHO 09/04/2019: Normal LVEF of 60 to 65%.  No RWMA.  Normal RV.  Normal right and left atria.  Normal mitral valve.  Mild AoV sclerosis but no stenosis.   [facility] Component 08/17/23 12/25/22 10/27/20  Cholesterol, Total 143 160 192  Triglycerides 60 53 59  HDL 55 61 67  VLDL Cholesterol Cal 12 11 11   LDL 76 88    Component 10/04/23 08/17/23  Glucose 160 High  196 High   BUN 39 High  37 High   Creatinine 2.36 High  2.46 High   eGFR 31 Low  29 Low   BUN/Creatinine Ratio 17 15  Sodium 139 137  Potassium 4.9 5.5 High   Chloride 105 104  CO2 22 20  CALCIUM 9.5 9.3  Total Protein 6.6 6.7   Albumin, Serum 4.3 4.2  Globulin, Total 2.3 2.5  Total Bilirubin 0.3 0.3  Alkaline Phosphatase 100 102  AST 19 18  ALT (SGPT) 13 13   Component 10/04/23 11/24/19  WBC 4.9 6.8  RBC  3.80 Low  4.06 Low   Hemoglobin 11.4 Low  --  Hematocrit 35.2 Low  36.0 Low   MCV 93 88.6  MCH 30.0 30.0  MCHC 32.4 33.9  RDW 12.4 13.0  Platelet Count 248     Risk Assessment/Calculations:          Physical Exam:   VS:  Pulse 62   Ht 5' 9 (1.753 m)   Wt 176 lb (79.8 kg)   SpO2 97%   BMI 25.99 kg/m    Wt Readings from Last 3 Encounters:  12/12/23 176 lb (79.8 kg)  06/21/23 179 lb (81.2 kg)  11/05/22 170 lb (77.1 kg)    GEN: Well nourished, well developed in no acute distress; Relatively healthy-appearing.  Well-groomed. NECK: No JVD; No carotid bruits CARDIAC: Normal S1, S2; RRR, no murmurs, rubs, gallops RESPIRATORY:  Clear to auscultation without rales, wheezing or rhonchi ; nonlabored, good air movement. ABDOMEN: Soft, non-tender, non-distended EXTREMITIES:  No edema; No deformity     ASSESSMENT AND PLAN: .    Problem List Items Addressed This Visit       Cardiology Problems   Essential hypertension (Chronic)   BP pretty well-controlled on current medications.  Has  been followed by nephrology and has evidence of proteinuria. He is on losartan 25 mg apparently replaced lisinopril 5 mg daily      Relevant Orders   CT CARDIAC SCORING (SELF PAY ONLY) (Completed)   Hyperlipidemia due to type 1 diabetes mellitus (HCC) (Chronic)   Unfortunately, he stopped pravastatin due to perceived muscle weakness and balance issues. Recent LDL of 76 which was at while taking statin. -Consider non-statin cholesterol-lowering medications such as Nexletol and Nexlizet depending on results of coronary calcium score.  Type 1 Diabetes Long-standing history since age 19-8.  Recent A1c of 6.6. -Continue current management and monitoring.        Other   Bruit of right carotid artery   -Order carotid Doppler given history of stroke and presence of carotid bruit on examination.      Relevant Orders   VAS US  CAROTID   Chest discomfort   Non-exertional, non-positional, intermittent chest discomfort for a couple of months. No associated symptoms suggestive of cardiac etiology. -Order coronary calcium score to assess for coronary artery disease given risk factors (Type 1 Diabetes, history of stroke). -With underlying CKD, if indicated by elevated Coronary Calcium Score is elevated, would opt for Cardiac Stress PET for ischemic evaluation if indicated to avoid contrast      History of CVA (cerebrovascular accident) (Chronic)   History of stroke in 2020 with residual balance issues and muscle weakness. No recent changes or new neurological symptoms.  Needs to have aggressive risk modifications, Checking carotid Dopplers due to right carotid bruit.  Continue close glycemic control as well as lipid and blood pressure management.      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  Anemia and proteinuria likely secondary to CKD. -Continue current management and monitoring. - Will require close attention if we do need to consider invasive ischemic evaluation.       Potentially Ordering Cardiac Stress PET Informed Consent   Shared Decision Making/Informed  Consent The risks [chest pain, shortness of breath, cardiac arrhythmias, dizziness, blood pressure fluctuations, myocardial infarction, stroke/transient ischemic attack, nausea, vomiting, allergic reaction, radiation exposure, metallic taste sensation and life-threatening complications (estimated to be 1 in 10,000)], benefits (risk stratification, diagnosing coronary artery disease, treatment guidance) and alternatives of a cardiac PET stress test were discussed in detail with Mr. Baumgart and he agrees to proceed.      Follow-Up: Return in about 2 months (around 02/09/2024) for Routine Follow-up after testing ~ 1-2 months.  I spent 62 minutes in the care of Spencer DEL Pecor today including reviewing outside labs from PCP & Nephrologist (2 min), reviewing studies (Echo, VQ Scan, Carotid Dopplers - 4 min), face to face time discussing treatment options (31 min), reviewing records from Nephrologist, PCP & hospital records (10 min), 15 min, and documenting in the encounter.     Signed, Spencer MICAEL Clay, MD, MS Spencer Burke, M.D., M.S. Interventional Cardiologist  [facility]  Pager # [phone] Phone # [phone] [address] [place], [place] [address]

## 2023-12-12 NOTE — Patient Instructions (Addendum)
 Medication Instructions:  No changes   *If you need a refill on your cardiac medications before your next appointment, please call your pharmacy*   Lab Work: Not needed    Testing/Procedures:  Will be schedule at [address] Your physician has requested that you have a  bilateral carotid duplex. This test is an ultrasound of the carotid arteries in your neck. It looks at blood flow through these arteries that supply the brain with blood. Allow one hour for this exam. There are no restrictions or special instructions.     CT coronary calcium score.   Test locations:  [facility]  [facility]  This is $99 out of pocket.   Coronary CalciumScan A coronary calcium scan is an imaging test used to look for deposits of calcium and other fatty materials (plaques) in the inner lining of the blood vessels of the heart (coronary arteries). These deposits of calcium and plaques can partly clog and narrow the coronary arteries without producing any symptoms or warning signs. This puts a person at risk for a heart attack. This test can detect these deposits before symptoms develop. Tell a health care provider about: Any allergies you have. All medicines you are taking, including vitamins, herbs, eye drops, creams, and over-the-counter medicines. Any problems you or family members have had with anesthetic medicines. Any blood disorders you have. Any surgeries you have had. Any medical conditions you have. Whether you are pregnant or may be pregnant. What are the risks? Generally, this is a safe procedure. However, problems may occur, including: Harm to a pregnant woman and her unborn baby. This test involves the use of radiation. Radiation exposure can be dangerous to a pregnant woman and her unborn baby. If you are pregnant, you generally should not have this procedure done. Slight  increase in the risk of cancer. This is because of the radiation involved in the test. What happens before the procedure? No preparation is needed for this procedure. What happens during the procedure? You will undress and remove any jewelry around your neck or chest. You will put on a hospital gown. Sticky electrodes will be placed on your chest. The electrodes will be connected to an electrocardiogram (ECG) machine to record a tracing of the electrical activity of your heart. A CT scanner will take pictures of your heart. During this time, you will be asked to lie still and hold your breath for 2-3 seconds while a picture of your heart is being taken. The procedure may vary among health care providers and hospitals. What happens after the procedure? You can get dressed. You can return to your normal activities. It is up to you to get the results of your test. Ask your health care provider, or the department that is doing the test, when your results will be ready. Summary A coronary calcium scan is an imaging test used to look for deposits of calcium and other fatty materials (plaques) in the inner lining of the blood vessels of the heart (coronary arteries). Generally, this is a safe procedure. Tell your health care provider if you are pregnant or may be pregnant. No preparation is needed for this procedure. A CT scanner will take pictures of your heart. You can return to your normal activities after the scan is done. This information is not intended to replace advice given to you by your health care provider. Make sure you discuss any  questions you have with your health care provider. Document Released: 05/18/2008 Document Revised: 10/09/2016 Document Reviewed: 10/09/2016 Elsevier Interactive Patient Education  2017 [employer].   Follow-Up: At [facility], you and your health needs are our priority.  As part of our continuing mission to provide you with exceptional heart care, we  have created designated Provider Care Teams.  These Care Teams include your primary Cardiologist (physician) and Advanced Practice Providers (APPs -  Physician Assistants and Nurse Practitioners) who all work together to provide you with the care you need, when you need it.     Your next appointment:   2 month(s)  The format for your next appointment:   In Person  Provider:   Alm Clay, MD

## 2023-12-13 ENCOUNTER — Ambulatory Visit (HOSPITAL_BASED_OUTPATIENT_CLINIC_OR_DEPARTMENT_OTHER)
Admission: RE | Admit: 2023-12-13 | Discharge: 2023-12-13 | Disposition: A | Discharge status: Home / Self Care | Payer: Self-pay | Source: Ambulatory Visit | Attending: Cardiology | Admitting: Cardiology

## 2023-12-13 DIAGNOSIS — E109 Type 1 diabetes mellitus without complications: Secondary | ICD-10-CM | POA: Insufficient documentation

## 2023-12-13 DIAGNOSIS — Z8673 Personal history of transient ischemic attack (TIA), and cerebral infarction without residual deficits: Secondary | ICD-10-CM | POA: Insufficient documentation

## 2023-12-13 DIAGNOSIS — I10 Essential (primary) hypertension: Secondary | ICD-10-CM | POA: Insufficient documentation

## 2023-12-15 ENCOUNTER — Encounter: Payer: Self-pay | Admitting: Cardiology

## 2023-12-15 DIAGNOSIS — R0789 Other chest pain: Secondary | ICD-10-CM | POA: Insufficient documentation

## 2023-12-15 DIAGNOSIS — N1832 Chronic kidney disease, stage 3b: Secondary | ICD-10-CM | POA: Insufficient documentation

## 2023-12-15 DIAGNOSIS — G3281 Cerebellar ataxia in diseases classified elsewhere: Secondary | ICD-10-CM | POA: Insufficient documentation

## 2023-12-15 NOTE — Assessment & Plan Note (Addendum)
 History of stroke in 2020 with residual balance issues and muscle weakness. No recent changes or new neurological symptoms.  Needs to have aggressive risk modifications, Checking carotid Dopplers due to right carotid bruit.  Continue close glycemic control as well as lipid and blood pressure management.

## 2023-12-15 NOTE — Assessment & Plan Note (Signed)
 Stage 3b - 4.  Anemia and proteinuria likely secondary to CKD. -Continue current management and monitoring. - Will require close attention if we do need to consider invasive ischemic evaluation.

## 2023-12-15 NOTE — Progress Notes (Signed)
 Significantly elevated coronary calcium score 1294-mostly distributed in the right coronary artery but also in the left anterior descending artery. This would suggest relatively significant amount of cholesterol plaque in the coronary arteries, and at this level especially since you had chest pain a few months ago at [facility] limited activity, I think it is reasonable for us  to proceed with stress test evaluation. Would like to order a Cardiac Stress PET to evaluate to see if there is any evidence of heart artery blockages or if this is just cholesterol plaque buildup.  I will need to call him to discuss the risks, benefits and alternatives of Cardiac Stress PET in order to place the order as an adjunct in order based on these results.   Alm Clay, MD  Reena - pls remind me to call him on Monday

## 2023-12-15 NOTE — Assessment & Plan Note (Signed)
-  Order carotid Doppler given history of stroke and presence of carotid bruit on examination.

## 2023-12-15 NOTE — Assessment & Plan Note (Signed)
 Unfortunately, he stopped pravastatin due to perceived muscle weakness and balance issues. Recent LDL of 76 which was at while taking statin. -Consider non-statin cholesterol-lowering medications such as Nexletol and Nexlizet depending on results of coronary calcium score.  Type 1 Diabetes Long-standing history since age 62-8.  Recent A1c of 6.6. -Continue current management and monitoring.

## 2023-12-15 NOTE — Assessment & Plan Note (Signed)
 Non-exertional, non-positional, intermittent chest discomfort for a couple of months. No associated symptoms suggestive of cardiac etiology. -Order coronary calcium score to assess for coronary artery disease given risk factors (Type 1 Diabetes, history of stroke). -With underlying CKD, if indicated by elevated Coronary Calcium Score is elevated, would opt for Cardiac Stress PET for ischemic evaluation if indicated to avoid contrast

## 2023-12-15 NOTE — Assessment & Plan Note (Addendum)
 BP pretty well-controlled on current medications.  Has been followed by nephrology and has evidence of proteinuria. He is on losartan 25 mg apparently replaced lisinopril 5 mg daily

## 2023-12-16 ENCOUNTER — Encounter: Payer: Self-pay | Admitting: Cardiology

## 2023-12-16 DIAGNOSIS — R072 Precordial pain: Secondary | ICD-10-CM

## 2023-12-16 DIAGNOSIS — R931 Abnormal findings on diagnostic imaging of heart and coronary circulation: Secondary | ICD-10-CM

## 2023-12-17 ENCOUNTER — Ambulatory Visit (HOSPITAL_COMMUNITY)
Admission: RE | Admit: 2023-12-17 | Discharge: 2023-12-17 | Disposition: A | Discharge status: Home / Self Care | Payer: Medicare Other | Source: Ambulatory Visit | Attending: Cardiology | Admitting: Cardiology

## 2023-12-17 DIAGNOSIS — Z8673 Personal history of transient ischemic attack (TIA), and cerebral infarction without residual deficits: Secondary | ICD-10-CM | POA: Insufficient documentation

## 2023-12-17 DIAGNOSIS — R0989 Other specified symptoms and signs involving the circulatory and respiratory systems: Secondary | ICD-10-CM | POA: Diagnosis present

## 2023-12-18 NOTE — Telephone Encounter (Signed)
 Cardiac PET stress test ordered

## 2023-12-18 NOTE — Telephone Encounter (Signed)
 I did meet with the patient today to discuss results of his CT scan and the recommendations for regular cardiac stress PET based on high likelihood for potential abnormal coronary disease findings with elevated coronary calcium score and chest pain.  Informed Consent   Shared Decision Making/Informed Consent The risks [chest pain, shortness of breath, cardiac arrhythmias, dizziness, blood pressure fluctuations, myocardial infarction, stroke/transient ischemic attack, nausea, vomiting, allergic reaction, radiation exposure, metallic taste sensation and life-threatening complications (estimated to be 1 in 10,000)], benefits (risk stratification, diagnosing coronary artery disease, treatment guidance) and alternatives of a cardiac PET stress test were discussed in detail with Spencer Burke and he agrees to proceed.        Spencer Clay, MD

## 2024-01-04 ENCOUNTER — Encounter (HOSPITAL_COMMUNITY): Payer: Self-pay

## 2024-01-09 ENCOUNTER — Encounter (HOSPITAL_COMMUNITY)
Admission: RE | Admit: 2024-01-09 | Discharge: 2024-01-09 | Disposition: A | Discharge status: Home / Self Care | Payer: Medicare Other | Source: Ambulatory Visit | Attending: Cardiology | Admitting: Cardiology

## 2024-01-09 DIAGNOSIS — Z136 Encounter for screening for cardiovascular disorders: Secondary | ICD-10-CM | POA: Insufficient documentation

## 2024-01-09 DIAGNOSIS — I700 Atherosclerosis of aorta: Secondary | ICD-10-CM | POA: Insufficient documentation

## 2024-01-09 DIAGNOSIS — R931 Abnormal findings on diagnostic imaging of heart and coronary circulation: Secondary | ICD-10-CM | POA: Diagnosis present

## 2024-01-09 DIAGNOSIS — I2510 Atherosclerotic heart disease of native coronary artery without angina pectoris: Secondary | ICD-10-CM | POA: Diagnosis not present

## 2024-01-09 LAB — NM PET CT CARDIAC PERFUSION MULTI W/ABSOLUTE BLOODFLOW
LV dias vol: 115 mL (ref 62–150)
LV sys vol: 42 mL
MBFR: 2.27
Nuc Rest EF: 63 %
Nuc Stress EF: 69 %
Rest MBF: 1.36 ml/g/min
Rest Nuclear Isotope Dose: 20.7 mCi
ST Depression (mm): 0 mm
Stress MBF: 3.09 ml/g/min
Stress Nuclear Isotope Dose: 20.7 mCi

## 2024-01-09 MED ORDER — REGADENOSON 0.4 MG/5ML IV SOLN
0.4000 mg | Freq: Once | INTRAVENOUS | Status: AC
  Administered 2024-01-09: 0.4 mg via INTRAVENOUS

## 2024-01-09 MED ORDER — RUBIDIUM RB82 GENERATOR (RUBYFILL)
20.6600 | PACK | Freq: Once | INTRAVENOUS | Status: AC
  Administered 2024-01-09: 20.66 via INTRAVENOUS

## 2024-01-09 MED ORDER — REGADENOSON 0.4 MG/5ML IV SOLN
INTRAVENOUS | Status: AC
  Filled 2024-01-09: qty 5

## 2024-01-09 MED ORDER — RUBIDIUM RB82 GENERATOR (RUBYFILL)
20.6400 | PACK | Freq: Once | INTRAVENOUS | Status: AC
  Administered 2024-01-09: 20.64 via INTRAVENOUS

## 2024-01-11 ENCOUNTER — Encounter: Payer: Self-pay | Admitting: Cardiology

## 2024-01-24 ENCOUNTER — Encounter: Payer: Self-pay | Admitting: Cardiology

## 2024-01-24 DIAGNOSIS — R931 Abnormal findings on diagnostic imaging of heart and coronary circulation: Secondary | ICD-10-CM | POA: Insufficient documentation

## 2024-01-24 HISTORY — DX: Abnormal findings on diagnostic imaging of heart and coronary circulation: R93.1

## 2024-01-24 NOTE — Assessment & Plan Note (Signed)
 Carotid bruit heard on exam, but no evidence of stenosis noted on Dopplers; Continue risk factor modification

## 2024-01-24 NOTE — Assessment & Plan Note (Signed)
 Very reassuring results on stress PET.  Most likely chest pain is not cardiac in nature.

## 2024-01-24 NOTE — Progress Notes (Unsigned)
 Cardiology Office Note:  .   Date:  01/25/2024  ID:  Spencer Burke, DOB 1961/07/26, MRN [id number] PCP: Spencer Sox, MD  [facility] Providers Cardiologist:  Spencer Lemma, MD     No chief complaint on file.   Patient Profile: .     Spencer Burke is a  63 y.o. male Non-smoker with a PMH notable for DM 1 (with stage IIIb-IV CKD, PN and retinopathy), prior CVA (pontine CVA) 6, HLD who presents here for close follow-up discuss test results at the request of Spencer Sox, MD.     Spencer Burke was seen for initial consultation on 12/12/2023 for baseline cardiology evaluation and chest pain at the request of Spencer Burke.  He noted having an episode of chest pain about 2 3 months prior to the visit.  He describes a tight sensation in the Burke of her chest, radiating to the back.  Notices episodic weakness and fatigue mostly with exertion.  No chest pain.  Decreased activity level.  Noted episode of nonexertional, nonpositional intermittent chest pain off and on. => Initial evaluation with with a Coronary Calcium Score that was significant elevated.  Based on high likelihood of abnormal CAD, Referred for Cardiac Stress PET.  Subjective  Discussed the use of AI scribe software for clinical note transcription with the patient, who gave verbal consent to proceed.  History of Present Illness   Spencer Nasworthy Pospisil "Will" is a 63 year old male with a history of stroke, HLD, DM-1 & elevated coronary calcium score who returns to discuss Stress PET results.    His major complaint is episodes of weakness and coordination issues. He experiences episodes of weakness and coordination issues, ongoing since his stroke. He feels extremely weak, especially after anaerobic activity, and sometimes feels like he might lose consciousness, though he never does. These episodes have occurred twice in the past three days, including once at the Spencer Burke where he felt weak and had difficulty holding his head up. He  notes these episodes are less frequent since stopping prednisone. His partner often notices these episodes before he occurs, and he feels 'not coordinated' during these times. Similar episodes have occurred at the Spencer Burke with his physical therapist, starting with a lack of coordination and progressing to extreme weakness, where he feels like he cannot keep his head up and wants to lay on the floor.  He has a history of stroke and small vessel disease noted on previous brain MRIs. He is unable to have gadolinium contrast due to kidney concerns, and his MRIs have been without contrast.  He has a history of high coronary calcium score and has previously been on pravastatin and atorvastatin, which he stopped due to muscle weakness and cramps. Stopping the statins improved his muscle function, although it did not resolve the episodes of weakness. He is not currently on any statin medication. He says that the weakness/coordination episode have also been less frequent & severe since stopping even the 10 mg Pravastatin (the last statin that he tried)  No chest pain, pressure, or shortness of breath during physical activity. He remains active and has had a normal stress test and carotid dopplers. His coronary calcium score was high, but his stress test showed normal function and flow reserves.        Objective   Intolerant of Pravastatin, atorvastatin & simvastatin  Studies Reviewed: .        Carotid Dopplers (12/17/2023): R ICA 1-39%.  Left ICA, no stenosis.  Normal vertebral and subclavian arteries. Coronary Calcium Score (12/22/2023): Total CAC 1284 (LAD 237, LCx 48, RCA 9 73) Stress PET (01/09/2024): Normal perfusion with no evidence of ischemia or infarction.  Rest EF 63%, stress EF 69% global myocardial flow reserve normal.  Severe calcification noted.  LOW RISK.  Spencer Burke Component 08/17/23 12/25/22 10/27/20  Cholesterol, Total 143 160 192  Triglycerides 60 53 59  HDL 55 61 67  VLDL  Cholesterol Cal 12 11 11   LDL 76 88      Risk Assessment/Calculations:            Physical Exam:   VS:  BP 134/68 (BP Location: Left Arm, Patient Position: Sitting, Cuff Size: Normal)   Pulse (!) 57   Ht 5\' 9"  (1.753 m)   Wt 178 lb 6.4 oz (80.9 kg)   SpO2 99%   BMI 26.35 kg/m    Wt Readings from Last 3 Encounters:  01/25/24 178 lb 6.4 oz (80.9 kg)  12/12/23 176 lb (79.8 kg)  06/21/23 179 lb (81.2 kg)    GEN: Healthy appearing.  Well nourished, well groomed in no acute distress;  NECK: No JVD; No carotid bruits CARDIAC: Normal S1, S2; RRR, no murmurs, rubs, gallops RESPIRATORY:  Clear to auscultation without rales, wheezing or rhonchi ; nonlabored, good air movement. ABDOMEN: Soft, non-tender, non-distended EXTREMITIES:  No edema; No deformity      ASSESSMENT AND PLAN: .    Problem List Items Addressed This Visit       Cardiology Problems   Elevated coronary artery calcium score (Chronic)   He is doing remarkably well with no active symptoms and nonischemic Stress PET. Continue to push cardiac risk factor modification: Glycemic control by endocrinology who is also monitoring his lipids Most recent lipid panel with an LDL of 76 shortly after having stopped pravastatin.  Due for labs to be checked in March, target LDL should be less than 70-if not at goal would consider Nexlizet BP relatively well-controlled with losartan 25 mg daily.  With him having these unusual weakness.  Question to heart to avoid any potential hypotension related complications orthostasis or vasovagal. On 81 mg aspirin.      Essential hypertension - Primary (Chronic)   BP controlled on low-dose losartan 25 mg daily.   No change for now. -Continue current dose of losartan      Hyperlipidemia due to type 1 diabetes mellitus (HCC) (Chronic)   High coronary calcium score indicating significant plaque buildup, but no obstructive disease. Patient has stopped statin therapy due to muscle weakness and  cramping. Last LDL was 76 in September 2024. Discussed the importance of maintaining low LDL levels to prevent progression of plaque buildup and stabilize existing plaque. -Check cholesterol levels in March 2025 with endocrinologist. -Consider starting Nexlizet if LDL levels are elevated. -Diabetes being managed by endocrinology-currently on Lantus and insulin pump.        Other   Bruit of right carotid artery (Chronic)   Carotid bruit heard on exam, but no evidence of stenosis noted on Dopplers; Continue risk factor modification      Chest discomfort   Very reassuring results on stress PET.  Most likely chest pain is not cardiac in nature.  Most likely having musculoskeletal issues as they are no longer present      History of CVA (cerebrovascular accident) (Chronic)   Remains on aspirin 81 mg and losartan for blood pressure.  Not currently on anything for lipid lowering.  Myalgia due to statin    Patient has stopped statin therapy due to muscle weakness and cramping. Last LDL was 76 in September 2024. Discussed the importance of maintaining low LDL levels to prevent progression of plaque buildup and stabilize existing plaque.  His lipids been pretty well-controlled, but has not had labs checked recently since being off of statin.  Due for labs in March.  We will review his labs at that time and consider Nexlizet.      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  Managed by nephrology. Needs to be taken into consideration when considering invasive ischemic evaluation        Leg pain and itching, possibly worse with walking.  Discussed the concept of claudication as leg angina due to decreased blood flow to leg muscles.  Encouraged to continue walking to maintain blood flow. -Continue regular walking and physical activity.  Episodes of Weakness Recurrent episodes of weakness and difficulty holding head up,  sometimes leading to near-collapse. No loss of consciousness. Episodes have been ongoing since a previous stroke and may be exacerbated by anaerobic activity. No cardiac symptoms associated with these episodes. -Continue monitoring symptoms.  Follow-up Given the patient's stable cardiac status and recent negative stress test, annual follow-up is appropriate unless there are changes in symptoms or lab results. -Annual follow-up or sooner if changes in symptoms or lab results. -Notify office when cholesterol labs are completed in March 2025.           Follow-Up: Return in about 1 year (around 01/24/2025) for 1 Yr Follow-up, [employer].      Signed, Marykay Lex, MD, MS Spencer Burke, M.D., M.S. Interventional Cardiologist  [facility]  Pager # [phone] Phone # [phone] [address] [place], [place] [address]

## 2024-01-25 ENCOUNTER — Ambulatory Visit: Payer: Medicare Other | Attending: Cardiology | Admitting: Cardiology

## 2024-01-25 ENCOUNTER — Encounter: Payer: Self-pay | Admitting: Cardiology

## 2024-01-25 VITALS — BP 134/68 | HR 57 | Ht 69.0 in | Wt 178.4 lb

## 2024-01-25 DIAGNOSIS — M791 Myalgia, unspecified site: Secondary | ICD-10-CM | POA: Diagnosis present

## 2024-01-25 DIAGNOSIS — T466X5A Adverse effect of antihyperlipidemic and antiarteriosclerotic drugs, initial encounter: Secondary | ICD-10-CM | POA: Diagnosis present

## 2024-01-25 DIAGNOSIS — E1069 Type 1 diabetes mellitus with other specified complication: Secondary | ICD-10-CM | POA: Diagnosis present

## 2024-01-25 DIAGNOSIS — Z8673 Personal history of transient ischemic attack (TIA), and cerebral infarction without residual deficits: Secondary | ICD-10-CM | POA: Insufficient documentation

## 2024-01-25 DIAGNOSIS — R931 Abnormal findings on diagnostic imaging of heart and coronary circulation: Secondary | ICD-10-CM | POA: Insufficient documentation

## 2024-01-25 DIAGNOSIS — R0989 Other specified symptoms and signs involving the circulatory and respiratory systems: Secondary | ICD-10-CM | POA: Diagnosis present

## 2024-01-25 DIAGNOSIS — I10 Essential (primary) hypertension: Secondary | ICD-10-CM | POA: Insufficient documentation

## 2024-01-25 DIAGNOSIS — E785 Hyperlipidemia, unspecified: Secondary | ICD-10-CM | POA: Insufficient documentation

## 2024-01-25 DIAGNOSIS — T466X5D Adverse effect of antihyperlipidemic and antiarteriosclerotic drugs, subsequent encounter: Secondary | ICD-10-CM

## 2024-01-25 DIAGNOSIS — R0789 Other chest pain: Secondary | ICD-10-CM | POA: Diagnosis present

## 2024-01-25 DIAGNOSIS — N1832 Chronic kidney disease, stage 3b: Secondary | ICD-10-CM | POA: Diagnosis present

## 2024-01-25 NOTE — Assessment & Plan Note (Signed)
 High coronary calcium score indicating significant plaque buildup, but no obstructive disease. Patient has stopped statin therapy due to muscle weakness and cramping. Last LDL was 76 in September 2024. Discussed the importance of maintaining low LDL levels to prevent progression of plaque buildup and stabilize existing plaque. -Check cholesterol levels in March 2025 with endocrinologist. -Consider starting Nexlizet if LDL levels are elevated. -Diabetes being managed by endocrinology-currently on Lantus and insulin pump.

## 2024-01-25 NOTE — Assessment & Plan Note (Signed)
 BP controlled on low-dose losartan 25 mg daily.   No change for now. -Continue current dose of losartan

## 2024-01-25 NOTE — Patient Instructions (Signed)
 Medication Instructions:   No changes *If you need a refill on your cardiac medications before your next appointment, please call your pharmacy*   Lab Work:   Please send a message that you have lab result available once you do them for your Endocrinologist   Testing/Procedures: Not needed   Follow-Up: At [facility], you and your health needs are our priority.  As part of our continuing mission to provide you with exceptional heart care, we have created designated Provider Care Teams.  These Care Teams include your primary Cardiologist (physician) and Advanced Practice Providers (APPs -  Physician Assistants and Nurse Practitioners) who all work together to provide you with the care you need, when you need it.     Your next appointment:   12 month(s)  The format for your next appointment:   In Person  Provider:   Bryan Lemma, MD    Other Instructions   Please send a message that you have lab result available once you do them for your Endocrinologist

## 2024-01-25 NOTE — Assessment & Plan Note (Signed)
 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  Managed by nephrology. Needs to be taken into consideration when considering invasive ischemic evaluation

## 2024-01-25 NOTE — Assessment & Plan Note (Signed)
 He is doing remarkably well with no active symptoms and nonischemic Stress PET. Continue to push cardiac risk factor modification: Glycemic control by endocrinology who is also monitoring his lipids Most recent lipid panel with an LDL of 76 shortly after having stopped pravastatin.  Due for labs to be checked in March, target LDL should be less than 70-if not at goal would consider Nexlizet BP relatively well-controlled with losartan 25 mg daily.  With him having these unusual weakness.  Question to heart to avoid any potential hypotension related complications orthostasis or vasovagal. On 81 mg aspirin.

## 2024-01-25 NOTE — Assessment & Plan Note (Signed)
 Remains on aspirin 81 mg and losartan for blood pressure.  Not currently on anything for lipid lowering.

## 2024-01-25 NOTE — Assessment & Plan Note (Signed)
 Patient has stopped statin therapy due to muscle weakness and cramping. Last LDL was 76 in September 2024. Discussed the importance of maintaining low LDL levels to prevent progression of plaque buildup and stabilize existing plaque.  His lipids been pretty well-controlled, but has not had labs checked recently since being off of statin.  Due for labs in March.  We will review his labs at that time and consider Nexlizet.

## 2024-02-06 ENCOUNTER — Other Ambulatory Visit (HOSPITAL_COMMUNITY): Payer: PRIVATE HEALTH INSURANCE

## 2024-03-03 ENCOUNTER — Other Ambulatory Visit: Payer: Self-pay | Admitting: Specialist

## 2024-03-03 DIAGNOSIS — R4182 Altered mental status, unspecified: Secondary | ICD-10-CM

## 2024-04-25 ENCOUNTER — Other Ambulatory Visit: Payer: Self-pay | Admitting: Specialist

## 2024-04-25 DIAGNOSIS — R4182 Altered mental status, unspecified: Secondary | ICD-10-CM

## 2024-05-04 ENCOUNTER — Ambulatory Visit

## 2024-05-04 DIAGNOSIS — R4182 Altered mental status, unspecified: Secondary | ICD-10-CM

## 2024-11-07 ENCOUNTER — Encounter: Payer: Self-pay | Admitting: Cardiology

## 2024-11-09 NOTE — Telephone Encounter (Signed)
 I think I only seen him 4 times and has been about 10 months since I last saw him.  Please also like concerning symptoms, dysautonomia evaluation is warranted potentially.  I think it would probably be best if he was able to be scheduled to be seen by either me or an APP with plan to potentially place referral.

## 2024-12-10 ENCOUNTER — Other Ambulatory Visit (HOSPITAL_COMMUNITY): Payer: Self-pay | Admitting: Family Medicine

## 2024-12-10 ENCOUNTER — Telehealth: Payer: Self-pay | Admitting: Pharmacy Technician

## 2024-12-10 DIAGNOSIS — D509 Iron deficiency anemia, unspecified: Secondary | ICD-10-CM | POA: Insufficient documentation

## 2024-12-10 DIAGNOSIS — D631 Anemia in chronic kidney disease: Secondary | ICD-10-CM | POA: Insufficient documentation

## 2024-12-10 NOTE — Telephone Encounter (Signed)
 Auth Submission: NO AUTH NEEDED Site of care: Site of care: [facility] Payer: medicare a/b Medication & CPT/J Code(s) submitted: Feraheme (ferumoxytol) [id number] Diagnosis Code: d50.9 Route of submission (phone, fax, portal):  Phone # Fax # Auth type: Buy/Bill PB Units/visits requested: 2 Reference number:  Approval from: 12/10/24 to 04/02/25

## 2024-12-15 ENCOUNTER — Encounter: Payer: Self-pay | Admitting: Cardiology

## 2024-12-15 ENCOUNTER — Ambulatory Visit: Attending: Cardiology | Admitting: Cardiology

## 2024-12-15 VITALS — BP 136/56 | HR 56 | Ht 69.0 in | Wt 175.0 lb

## 2024-12-15 DIAGNOSIS — E1069 Type 1 diabetes mellitus with other specified complication: Secondary | ICD-10-CM | POA: Insufficient documentation

## 2024-12-15 DIAGNOSIS — E785 Hyperlipidemia, unspecified: Secondary | ICD-10-CM | POA: Diagnosis not present

## 2024-12-15 DIAGNOSIS — G3281 Cerebellar ataxia in diseases classified elsewhere: Secondary | ICD-10-CM | POA: Diagnosis not present

## 2024-12-15 DIAGNOSIS — I10 Essential (primary) hypertension: Secondary | ICD-10-CM | POA: Diagnosis not present

## 2024-12-15 DIAGNOSIS — N1832 Chronic kidney disease, stage 3b: Secondary | ICD-10-CM | POA: Insufficient documentation

## 2024-12-15 DIAGNOSIS — T466X5D Adverse effect of antihyperlipidemic and antiarteriosclerotic drugs, subsequent encounter: Secondary | ICD-10-CM

## 2024-12-15 DIAGNOSIS — Z8673 Personal history of transient ischemic attack (TIA), and cerebral infarction without residual deficits: Secondary | ICD-10-CM | POA: Insufficient documentation

## 2024-12-15 DIAGNOSIS — R931 Abnormal findings on diagnostic imaging of heart and coronary circulation: Secondary | ICD-10-CM | POA: Insufficient documentation

## 2024-12-15 DIAGNOSIS — M791 Myalgia, unspecified site: Secondary | ICD-10-CM | POA: Diagnosis not present

## 2024-12-15 DIAGNOSIS — T466X5A Adverse effect of antihyperlipidemic and antiarteriosclerotic drugs, initial encounter: Secondary | ICD-10-CM | POA: Diagnosis present

## 2024-12-15 DIAGNOSIS — G9089 Other disorders of autonomic nervous system: Secondary | ICD-10-CM | POA: Diagnosis present

## 2024-12-15 NOTE — Progress Notes (Signed)
 " Cardiology Office Note:  .   Date:  12/19/2024  ID:  Spencer Burke, DOB [birth year], MRN [id number] PCP: Clide Coy, MD  [facility] Providers Cardiologist:  Alm Clay, MD     Chief Complaint  Patient presents with   Follow-up    Remains very healthy.  Hikes and does exercise routinely -> but now noting more easy fatigue and some mild exercise intolerance.   Coronary Artery Disease    Elevated CAC with DM-1 and HTN.  Due for lipid check    Patient Profile: .     Spencer Burke is a 64 y.o. male non-smoker with a PMH notable for longstanding DM-1 with stage IIIb-IV CKD and PN/retinopathy, H/so pontine CVA, HLD (with statin myopathy) and elevated CAC who presents here for annual follow-up at the request of Clide Coy, MD.  Spencer Burke was seen for initial consultation back in January 2025 due to elevated CAC and then seen in follow-up a month later to discuss results of his cardiac stress PET.      Subjective  Discussed the use of AI scribe software for clinical note transcription with the patient, who gave verbal consent to proceed.  History of Present Illness Spencer Burke Spencer Burke is a 64 year old male with coronary artery disease and diabetes who presents with exercise intolerance and fatigue. He was referred by Dr. Leigh, his neurologist, for evaluation of dysautonomia.  Over the past year, he has experienced a significant decrease in exercise tolerance, feeling weak and tired after physical activity. He often requires two to four hours of sleep following exercise, such as biking or hiking, and sometimes sleeps in his car before going into the gym. He feels 'worn out a lot' and describes episodes of weakness and balance issues, particularly after anaerobic exercises like using a squat machine or pushing a sled. During these episodes, he feels 'puffy around the eyes' and sometimes stumbles or loses balance.  He has a history of a pontine stroke and was referred to  [facility] dys[facility] program by his neurologist, who indicated that dysautonomia had been considered in the past. He is currently under the care of [facility] for nephrology and has been referred to [facility] dysautonomia program for further evaluation.  He reports symptoms of fluid retention, feeling puffy in the face, and occasional swelling. No shortness of breath when lying flat or waking up short of breath. He has not started taking Lasix due to concerns about frequent urination. He is scheduled for two iron infusions next week, which he believes may be due to mild anemia. He is currently taking Lokelma for potassium management.  He mentions issues with digestion, noting that he has to keep sugar in his mouth to treat low blood sugars because it does not digest quickly enough. He experiences occasional constipation and diarrhea, which he does not consider a major issue. No chest pain, tightness, or heart racing. His recent stress test was performed and he reports being able to exercise, though with reduced capacity. However, he feels his overall physical capacity has declined compared to previous years.    Objective   Medications: Aspirin 81 mg daily; Losartan 25 mg daily Insulin pump along with Lantus.  Studies Reviewed: SABRA   EKG Interpretation Date/Time:  Monday December 15 2024 09:39:13 EST Ventricular Rate:  54 PR Interval:  150 QRS Duration:  94 QT Interval:  422 QTC Calculation: 400 R Axis:   73  Text Interpretation: Sinus bradycardia When compared  with ECG of 12-Dec-2023 14:07, No significant change was found Confirmed by Anner Lenis ([id number]) on 12/15/2024 10:29:43 AM    Labs from 11/21/2024 Sodium 135 - 145 mmol/L 138  Potassium 3.5 - 5.0 mmol/L 4.6  Chloride 98 - 108 mmol/L 104  Carbon Dioxide (CO2) 21 - 30 mmol/L 20 Low   Urea Nitrogen (BUN) 7 - 20 mg/dL 50 High   Creatinine 0.6 - 1.3 mg/dL 2.3 High   Calcium 8.7 - 10.2 mg/dL 9.4  Phosphorus 2.3 - 4.5 mg/dL  3.6   Glucose 70 - 859 mg/dL 872   WBC (White Blood Cell Count) 3.2 - 9.8 x109/L 7.5  Hemoglobin 13.7 - 17.3 g/dL 88.6 Low   Hematocrit 60.9 - 49.0 % 35.7 Low   Platelets 150 - 450 x109/L 214   Coronary Calcium Score CT (12/13/2023): Total score 1294.  LAD 273, LCx 48, RCA 973. Cardiac Stress PET (01/09/2024): LOW RISK.Normal LV size and function.  No ischemia or infarction.  Rest EF 63%, stress EF 69%.  Normal global flow.  Risk Assessment/Calculations:            Physical Exam:   VS:  BP (!) 136/56   Pulse (!) 56   Ht 5' 9 (1.753 m)   Wt 175 lb (79.4 kg)   SpO2 99%   BMI 25.84 kg/m    Wt Readings from Last 3 Encounters:  12/18/24 176 lb 3.2 oz (79.9 kg)  12/15/24 175 lb (79.4 kg)  01/25/24 178 lb 6.4 oz (80.9 kg)     GEN: Well nourished, well groomed; in no acute distress; healthy-appearing. NECK: No JVD; No carotid bruits CARDIAC: Normal S1, S2; RRR, no murmurs, rubs, gallops RESPIRATORY:  Clear to auscultation without rales, wheezing or rhonchi ; nonlabored, good air movement. ABDOMEN: Soft, non-tender, non-distended EXTREMITIES:  No edema; No deformity      ASSESSMENT AND PLAN: .   Myalgia due to statin Failed Atorvastatin, Rosuvastatin & Pravastatin in the past Intolerance to statins with muscle weakness and myalgias. - Ordered lipid panel. - Consider injectable cholesterol-lowering medication if indicated.  Elevated coronary artery calcium score Previous stress test reassuring.  Exercise intolerance without chest pain or dyspnea. - Continue current management and monitor for new symptoms. - Consider repeat stress test if exercise intolerance worsens.  - Need to check lipids and consider management if LDL not at goal - Borderline elevated BP today on losartan, if pressures increase, increase losartan dose.  Not appropriate for type 1 diabetes -DM-1 management endocrinology on insulin.  Hyperlipidemia due to type 1 diabetes mellitus (HCC) With elevated  CAC and DM-1, Spencer Burke try to target LDL less than 70 based on elevated CAC. Not currently on statin, due to significant statin myopathy. - Ordered lipid panel. - Consider injectable cholesterol-lowering medication if indicated.  Type 1 diabetes mellitus with hyperlipidemia and autonomic neuropathy Difficulty with gastric absorption of sugars. Weakness, fatigue, and balance issues likely due to autonomic neuropathy. - Referred to [facility]ia program.  Stage 3b chronic kidney disease (HCC) Puffiness and swelling possibly related to venous stasis. Under nephrology care. - Continue follow-up with nephrologist Dr. Marella. - Use Lasix as needed for fluid retention, avoiding use before exercise or hydration. - Also started on Lokelma - Monitor kidney function and adjust treatment as necessary.  Essential hypertension Blood pressure slightly elevated but generally well-controlled. Under nephrology care. - Continue current antihypertensive regimen.-Losartan 25 mg daily - Monitor blood pressure and adjust treatment as needed with nephrologist.  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 Needs cardiology approval.  We Spencer Burke go ahead and place referral.  History of stroke History of cerebrovascular accident Residual balance issues post-exercise-cerebral artery ataxia.  Planning to refer to [facility]  Dysautonomia-like disorder Currently seeing neurology and they have recommended referral to dysautonomia clinic at [facility].  Referral placed.   Orders Placed This Encounter  Procedures   Lipoprotein A (LPA)   Hepatic function panel   Lipid panel   Ambulatory referral to Cardiac Electrophysiology   EKG 12-Lead            Follow-Up: Return in about 1 year (around 12/15/2025) for [employer].  I spent 50 minutes in the care of  Spencer Burke today including reviewing labs (1 minute), reviewing outside labs from Care Everywhere (additional 1 minute), reviewing studies (4 minutes reviewing studies above), face to face time discussing treatment options (28 minutes), reviewing records from my last clinic note as well as notes of endocrinology (5 minutes), 11 minutes dictating, and documenting in the encounter.      Signed, Alm MICAEL Clay, MD, MS Alm Clay, M.D., M.S. Interventional Cardiologist  [facility] Pager # [phone]      "

## 2024-12-15 NOTE — Assessment & Plan Note (Addendum)
 Failed Atorvastatin, Rosuvastatin & Pravastatin in the past Intolerance to statins with muscle weakness and myalgias. - Ordered lipid panel. - Consider injectable cholesterol-lowering medication if indicated.

## 2024-12-15 NOTE — Patient Instructions (Signed)
 Medication Instructions:   Not needed *If you need a refill on your cardiac medications before your next appointment, please call your pharmacy*   Lab Work: tomorrow  fasting  Liver panel Lipid Lp(a)   If you have labs (blood work) drawn today and your tests are completely normal, you will receive your results only by: MyChart Message (if you have MyChart) OR A paper copy in the mail If you have any lab test that is abnormal or we need to change your treatment, we will call you to review the results.   Testing/Procedures:  Not needed  Follow-Up: At [facility], you and your health needs are our priority.  As part of our continuing mission to provide you with exceptional heart care, we have created designated Provider Care Teams.  These Care Teams include your primary Cardiologist (physician) and Advanced Practice Providers (APPs -  Physician Assistants and Nurse Practitioners) who all work together to provide you with the care you need, when you need it.     Your next appointment:   12 month(s)  The format for your next appointment:   In Person  Provider:   Alm Clay, MD  You have been referred  to [facility] - Dysautonomic clinic Other Instructions

## 2024-12-17 LAB — LIPOPROTEIN A (LPA): Lipoprotein (a): 9.9 nmol/L

## 2024-12-17 LAB — LIPID PANEL
Chol/HDL Ratio: 3.1 ratio (ref 0.0–5.0)
Cholesterol, Total: 160 mg/dL (ref 100–199)
HDL: 51 mg/dL
LDL Chol Calc (NIH): 91 mg/dL (ref 0–99)
Triglycerides: 100 mg/dL (ref 0–149)
VLDL Cholesterol Cal: 18 mg/dL (ref 5–40)

## 2024-12-17 LAB — HEPATIC FUNCTION PANEL
ALT: 14 IU/L (ref 0–44)
AST: 15 IU/L (ref 0–40)
Albumin: 4.2 g/dL (ref 3.9–4.9)
Alkaline Phosphatase: 88 IU/L (ref 47–123)
Bilirubin Total: 0.3 mg/dL (ref 0.0–1.2)
Bilirubin, Direct: 0.11 mg/dL (ref 0.00–0.40)
Total Protein: 6.6 g/dL (ref 6.0–8.5)

## 2024-12-18 ENCOUNTER — Ambulatory Visit: Payer: Self-pay | Admitting: Cardiology

## 2024-12-18 ENCOUNTER — Ambulatory Visit

## 2024-12-18 VITALS — BP 131/69 | HR 59 | Temp 97.8°F | Resp 14 | Ht 69.0 in | Wt 176.2 lb

## 2024-12-18 DIAGNOSIS — D631 Anemia in chronic kidney disease: Secondary | ICD-10-CM | POA: Diagnosis not present

## 2024-12-18 DIAGNOSIS — D509 Iron deficiency anemia, unspecified: Secondary | ICD-10-CM

## 2024-12-18 DIAGNOSIS — N1832 Chronic kidney disease, stage 3b: Secondary | ICD-10-CM | POA: Diagnosis not present

## 2024-12-18 MED ORDER — DIPHENHYDRAMINE HCL 25 MG PO CAPS
25.0000 mg | ORAL_CAPSULE | Freq: Once | ORAL | Status: AC
  Administered 2024-12-18: 25 mg via ORAL
  Filled 2024-12-18: qty 1

## 2024-12-18 MED ORDER — SODIUM CHLORIDE 0.9 % IV SOLN
510.0000 mg | Freq: Once | INTRAVENOUS | Status: AC
  Administered 2024-12-18: 510 mg via INTRAVENOUS
  Filled 2024-12-18: qty 17

## 2024-12-18 NOTE — Progress Notes (Signed)
 Diagnosis: Iron Deficiency Anemia  Provider:  Praveen Mannam MD  Procedure: IV Infusion  IV Type: Peripheral, IV Location: R Antecubital   Feraheme  (Ferumoxytol ), Dose: 510 mg  Infusion Start Time: 1404  Infusion Stop Time: 1420  Post Infusion IV Care: Observation period completed and Peripheral IV Discontinued  Discharge: Condition: Good, Destination: Home . AVS Declined  Performed by:  Leita FORBES Miles, LPN

## 2024-12-19 ENCOUNTER — Encounter: Payer: Self-pay | Admitting: Cardiology

## 2024-12-19 DIAGNOSIS — G9089 Other disorders of autonomic nervous system: Secondary | ICD-10-CM | POA: Insufficient documentation

## 2024-12-19 NOTE — Assessment & Plan Note (Signed)
 Puffiness and swelling possibly related to venous stasis. Under nephrology care. - Continue follow-up with nephrologist Dr. Marella. - Use Lasix as needed for fluid retention, avoiding use before exercise or hydration. - Also started on Lokelma - Monitor kidney function and adjust treatment as necessary.

## 2024-12-19 NOTE — Assessment & Plan Note (Signed)
 With elevated CAC and DM-1, will try to target LDL less than 70 based on elevated CAC. Not currently on statin, due to significant statin myopathy. - Ordered lipid panel. - Consider injectable cholesterol-lowering medication if indicated.  Type 1 diabetes mellitus with hyperlipidemia and autonomic neuropathy Difficulty with gastric absorption of sugars. Weakness, fatigue, and balance issues likely due to autonomic neuropathy. - Referred to [facility]onomia program.

## 2024-12-19 NOTE — Progress Notes (Signed)
 Last read by Elsie VEAR Free Will at 1:10AM on 12/19/2024.

## 2024-12-19 NOTE — Assessment & Plan Note (Signed)
 Previous stress test reassuring.  Exercise intolerance without chest pain or dyspnea. - Continue current management and monitor for new symptoms. - Consider repeat stress test if exercise intolerance worsens.  - Need to check lipids and consider management if LDL not at goal - Borderline elevated BP today on losartan, if pressures increase, increase losartan dose.  Not appropriate for type 1 diabetes -DM-1 management endocrinology on insulin.

## 2024-12-19 NOTE — Assessment & Plan Note (Signed)
 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 Needs cardiology approval.  We will go ahead and place referral.

## 2024-12-19 NOTE — Assessment & Plan Note (Signed)
 Blood pressure slightly elevated but generally well-controlled. Under nephrology care. - Continue current antihypertensive regimen.-Losartan 25 mg daily - Monitor blood pressure and adjust treatment as needed with nephrologist.

## 2024-12-19 NOTE — Assessment & Plan Note (Signed)
 History of cerebrovascular accident Residual balance issues post-exercise-cerebral artery ataxia.  Planning to refer to [facility]

## 2024-12-19 NOTE — Assessment & Plan Note (Signed)
 Currently seeing neurology and they have recommended referral to dysautonomia clinic at [facility].  Referral placed.

## 2024-12-25 ENCOUNTER — Ambulatory Visit

## 2024-12-25 VITALS — BP 139/75 | HR 56 | Temp 97.5°F | Resp 16 | Ht 69.0 in | Wt 175.8 lb

## 2024-12-25 DIAGNOSIS — D631 Anemia in chronic kidney disease: Secondary | ICD-10-CM | POA: Diagnosis not present

## 2024-12-25 DIAGNOSIS — D509 Iron deficiency anemia, unspecified: Secondary | ICD-10-CM | POA: Diagnosis not present

## 2024-12-25 DIAGNOSIS — N1832 Chronic kidney disease, stage 3b: Secondary | ICD-10-CM | POA: Diagnosis not present

## 2024-12-25 MED ORDER — SODIUM CHLORIDE 0.9 % IV SOLN
510.0000 mg | Freq: Once | INTRAVENOUS | Status: AC
  Administered 2024-12-25: 510 mg via INTRAVENOUS
  Filled 2024-12-25: qty 17

## 2024-12-25 NOTE — Progress Notes (Signed)
 Received fax from [facility] requesting no pre-medications. Premeds removed from treatment plan.  Sherry Pennant, PharmD, MPH, BCPS, CPP Clinical Pharmacist

## 2024-12-25 NOTE — Progress Notes (Signed)
 Diagnosis: Iron Deficiency Anemia  Provider:  Praveen Mannam MD  Procedure: IV Infusion  IV Type: Peripheral, IV Location: R Antecubital   Feraheme  (Ferumoxytol ), Dose: 510 mg  Infusion Start Time: 1233  Infusion Stop Time: 1249  Post Infusion IV Care: Patient declined observation and Peripheral IV Discontinued  Discharge: Condition: Good, Destination: Home . AVS Declined  Performed by:  Leita FORBES Miles, LPN

## 2024-12-26 ENCOUNTER — Ambulatory Visit (HOSPITAL_COMMUNITY): Admission: EM | Admit: 2024-12-26 | Discharge: 2024-12-26 | Disposition: A | Discharge status: Home / Self Care

## 2024-12-26 ENCOUNTER — Encounter (HOSPITAL_COMMUNITY): Payer: Self-pay | Admitting: *Deleted

## 2024-12-26 DIAGNOSIS — N183 Chronic kidney disease, stage 3 unspecified: Secondary | ICD-10-CM

## 2024-12-26 DIAGNOSIS — E1022 Type 1 diabetes mellitus with diabetic chronic kidney disease: Secondary | ICD-10-CM | POA: Diagnosis not present

## 2024-12-26 DIAGNOSIS — L03032 Cellulitis of left toe: Secondary | ICD-10-CM

## 2024-12-26 MED ORDER — MUPIROCIN 2 % EX OINT: 1.0000 | TOPICAL_OINTMENT | Freq: Two times a day (BID) | CUTANEOUS | 0 refills | Status: AC

## 2024-12-26 MED ORDER — CEPHALEXIN 500 MG PO CAPS: 500.0000 mg | ORAL_CAPSULE | Freq: Two times a day (BID) | ORAL | 0 refills | Status: AC

## 2024-12-26 NOTE — Discharge Instructions (Addendum)
 Take cephalexin antibiotic every 12 hours for 7 days. Watch for signs of worsening infection such as streaking redness to the top of the foot, worsening redness of the toe, new drainage from the wound, fever, chills, body aches, or nausea/vomiting.  Please schedule a follow-up appointment with your podiatrist for next week.   Go to the ER if you develop signs of worsening infection. Stay safe in the snow/ice!

## 2024-12-26 NOTE — ED Triage Notes (Signed)
 C/O left great toe problem -- pt concerned he may have a splinter. States noticed a very tiny pinpoint spot of tenderness yesterday. Pt concerned as he is a Type I diabetic.

## 2024-12-26 NOTE — ED Provider Notes (Signed)
 "   [facility]    CSN: [id number] Arrival date & time: 12/26/24  1121      History   Chief Complaint No chief complaint on file.   HPI Spencer Burke is a 64 y.o. male.   Spencer Burke is a 64 y.o. male presenting for chief complaint of left great toe pain and possible infected lesion that he first noticed a couple of days ago.  History of type 1 diabetes with diabetic neuropathy to bilateral feet/ankles.  He states he is not normally able to feel his toes very well and when he felt pain at the distal left great toe yesterday he became concerned.  He walks around the house with shoes and socks on at all times, he spends minimal time without foot production on while ambulating.  He does not recall any trauma/injuries to the left great toe.  He is concerned that there may be a splinter to the tip of the toe and has tried to take pictures of his foot to be able to evaluate and investigate this on his own but has had a hard time assessing it at home.  He sees podiatry regularly for toenail trims (onychomycosis) and recently saw them 4 to 5 days ago where he had his toenails trimmed.  He has noticed a little bit of redness to the tip of the left great toe but has not noticed any drainage and denies any pain/swelling surrounding the toenail itself.  Denies recent antibiotic use in the last 90 days.  He is allergic to tetracyclines and amoxicillin (nausea/vomiting).  He has not attempted treatment of symptoms at home.  Sugars are well-controlled.  Last hemoglobin A1c 6.8 on November 21, 2024. He is concerned that he may be developing an infection to the toe and would like to go ahead and get treatment today due to the upcoming ice storm.     Past Medical History:  Diagnosis Date   ADD (attention deficit disorder)    Cervical stenosis of spine    DM (diabetes mellitus) type I controlled with renal manifestation (HCC)    Diagnosed at age 55: Complicated by CKD IIIb-IV, bilateral  proliferative retinopathy and polyneuropathy; proteinuria   Elevated coronary artery calcium score 01/24/2024   Coronary Calcium Score (12/22/2023): Total CAC 1284 (LAD 237, LCx 48, RCA 9 73) Stress PET (01/09/2024): Normal perfusion with no evidence of ischemia or infarction.  Rest EF 63%, stress EF 69% global myocardial flow reserve normal.  Severe calcification noted.  LOW RISK.    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/15/2023   Stage 3b chronic kidney disease (HCC) 12/15/2023   Chest discomfort 12/15/2023   Hyperlipidemia due to type 1 diabetes mellitus (HCC) 12/12/2023   History of stroke 12/12/2023   Bruit of right carotid artery 12/12/2023   Essential hypertension 12/12/2023   Attention deficit disorder 09/08/2014   Insulin dependent type 1 diabetes mellitus (HCC) 11/04/2010    Past Surgical History:  Procedure Laterality Date   FOOT FRACTURE SURGERY Left 12/04/2013   foot and ankle (foot this  yr)   MOUTH SURGERY     NASAL SINUS SURGERY  06/03/2009   REFRACTIVE SURGERY  04/03/2009   SHOULDER SURGERY Left 09/03/2009   TRANSTHORACIC ECHOCARDIOGRAM  09/2019   Normal LVEF of 60 to 65%.  No RWMA.  Normal RV.  Normal right and left atria.  Normal mitral valve.  Mild AoV sclerosis but no stenosis.       Home Medications    Prior to Admission medications  Medication Sig Start Date End Date Taking? Authorizing Provider  ASPIRIN PO Take 81 mg by mouth.   Yes [provider]  cephALEXin (KEFLEX) 500 MG capsule Take 1 capsule (500 mg total) by  mouth 2 (two) times daily for 7 days. 12/26/24 01/02/25 Yes Azalynn Maxim, Dorna HERO, FNP  Cyanocobalamin 500 MCG SUBL Place under the tongue.   Yes [provider]  folic acid (FOLVITE) 1 MG tablet Take 1 mg by mouth daily.   Yes [provider]  FUROSEMIDE PO Take by mouth. Prn swelling -- has not started yet   Yes [provider]  insulin aspart (NOVOLOG) 100 UNIT/ML injection Inject into the skin 3 (three) times daily before meals. Use as directed for insulin pump.   Yes [provider]  Insulin Human (INSULIN PUMP) SOLN by Intravenous (Continuous Infusion) route. 5 units, Basal rate continous   Yes [provider]  losartan (COZAAR) 25 MG tablet Take 1 tablet by mouth daily. 01/23/23  Yes [provider]  mupirocin ointment (BACTROBAN) 2 % Apply 1 Application topically 2 (two) times daily. 12/26/24  Yes Enedelia Dorna HERO, FNP  sodium zirconium cyclosilicate (LOKELMA) 10 g PACK packet Take 10 g by mouth every other day.   Yes [provider]  thiamine (VITAMIN B1) 100 MG tablet Take by mouth. 06/21/21  Yes [provider]  aspirin 325 MG tablet Take 81 mg by mouth daily.    [provider]  Glucagon (GVOKE HYPOPEN 2-PACK) 1 MG/0.2ML SOAJ Use per package direction in the event of extremely low blood sugar 09/15/21   [provider]  glucose blood test strip CHECK BLOOD SUGAR 10-12 TIMES DAILY 04/05/15   [provider]  LANTUS 100 UNIT/ML injection     [provider]    Family History Family History  Problem Relation Age of Onset   Transient ischemic attack Mother    Transient ischemic attack Father    Coronary artery disease Father 44       Followed by Dr. Anner.  PTCA in the 1990s    Social History Social History[1]   Allergies   Amoxicillin, Atorvastatin, Pravastatin sodium, and Tetracyclines & related   Review of Systems Review of Systems Per HPI  Physical Exam Triage  Vital Signs ED Triage Vitals  Encounter Vitals Group     BP 12/26/24 1200 (!) 151/74     Girls Systolic BP Percentile --      Girls Diastolic BP Percentile --      Boys Systolic BP Percentile --      Boys Diastolic BP Percentile --      Pulse Rate 12/26/24 1200 (!) 58     Resp 12/26/24 1200 16     Temp 12/26/24 1200 97.8 F (36.6 C)     Temp Source 12/26/24 1200 Oral     SpO2 12/26/24 1200 98 %     Weight --      Height --      Head Circumference --  Peak Flow --      Pain Score 12/26/24 1202 2     Pain Loc --      Pain Education --      Exclude from Growth Chart --    No data found.  Updated Vital Signs BP (!) 151/74   Pulse (!) 58   Temp 97.8 F (36.6 C) (Oral)   Resp 16   SpO2 98%   Visual Acuity Right Eye Distance:   Left Eye Distance:   Bilateral Distance:    Right Eye Near:   Left Eye Near:    Bilateral Near:     Physical Exam Vitals and nursing note reviewed.  Constitutional:      Appearance: He is not ill-appearing or toxic-appearing.  HENT:     Head: Normocephalic and atraumatic.     Right Ear: Hearing and external ear normal.     Left Ear: Hearing and external ear normal.     Nose: Nose normal.     Mouth/Throat:     Lips: Pink.  Eyes:     General: Lids are normal. Vision grossly intact. Gaze aligned appropriately.     Extraocular Movements: Extraocular movements intact.     Conjunctiva/sclera: Conjunctivae normal.  Pulmonary:     Effort: Pulmonary effort is normal.  Musculoskeletal:     Cervical back: Neck supple.  Feet:     Left foot:     Skin integrity: Erythema and dry skin present. No warmth.     Toenail Condition: Left toenails are abnormally thick. Fungal disease present.    Comments: Tender to palpation over the pinpoint pustular lesion as seen in image below to the medial distal portion of the left great toe pad.  Minimal erythema present to the overlying skin of the distal left great toe without warmth to palpation.  +2 left  dorsalis pedis pulse.  Less than 2 cap refill to affected digit.  Skin:    General: Skin is warm and dry.     Capillary Refill: Capillary refill takes less than 2 seconds.     Findings: No rash.  Neurological:     General: No focal deficit present.     Mental Status: He is alert and oriented to person, place, and time. Mental status is at baseline.     Cranial Nerves: No dysarthria or facial asymmetry.  Psychiatric:        Mood and Affect: Mood normal.        Speech: Speech normal.        Behavior: Behavior normal.        Thought Content: Thought content normal.        Judgment: Judgment normal.      UC Treatments / Results  Labs (all labs ordered are listed, but only abnormal results are displayed) Labs Reviewed - No data to display  EKG   Radiology No results found.  Procedures Procedures (including critical care time)  Medications Ordered in UC Medications - No data to display  Initial Impression / Assessment and Plan / UC Course  I have reviewed the triage vital signs and the nursing notes.  Pertinent labs & imaging results that were available during my care of the patient were reviewed by me and considered in my medical decision making (see chart for details).   1.  Type 1 diabetes with stage IIIa chronic kidney disease, cellulitis of left great toe Left great toe pustule/surrounding minimal erythema concerning for left toe infection/cellulitis of left great toe. His  creatinine clearance based on most recent labs drawn in December 2025 shows CC of 35 mL/h corrected for height/weight. Heels allergic to tetracyclines and therefore may not have doxycycline.  Allergic to amoxicillin (nausea and vomiting), no history of anaphylaxis with amoxicillin. I would like to treat him with Keflex 500 mg twice daily for 7 days. Mupirocin topically twice daily.  Close follow-up with [facility] encouraged to ensure infection is improving and not worsening.  ER  precautions discussed.  He is currently neurovascularly intact to baseline.  Counseled patient on potential for adverse effects with medications prescribed/recommended today, strict ER and return-to-clinic precautions discussed, patient verbalized understanding.    Final Clinical Impressions(s) / UC Diagnoses   Final diagnoses:  Type 1 diabetes mellitus with stage 3 chronic kidney disease, unspecified whether stage 3a or 3b CKD (HCC)  Cellulitis of left toe     Discharge Instructions      Take cephalexin antibiotic every 12 hours for 7 days. Watch for signs of worsening infection such as streaking redness to the top of the foot, worsening redness of the toe, new drainage from the wound, fever, chills, body aches, or nausea/vomiting.  Please schedule a follow-up appointment with your podiatrist for next week.   Go to the ER if you develop signs of worsening infection. Stay safe in the snow/ice!     ED Prescriptions     Medication Sig Dispense Auth. Provider   cephALEXin (KEFLEX) 500 MG capsule Take 1 capsule (500 mg total) by mouth 2 (two) times daily for 7 days. 14 capsule Quindell Shere M, FNP   mupirocin ointment (BACTROBAN) 2 % Apply 1 Application topically 2 (two) times daily. 22 g Enedelia Dorna HERO, FNP      PDMP not reviewed this encounter.    [1]  Social History Tobacco Use   Smoking status: Never   Smokeless tobacco: Never  Vaping Use   Vaping status: Never Used  Substance Use Topics   Alcohol use: No   Drug use: No     Enedelia Dorna HERO, FNP 12/26/24 1455  "

## 2025-01-09 ENCOUNTER — Telehealth: Payer: Self-pay | Admitting: Pharmacist

## 2025-01-09 ENCOUNTER — Ambulatory Visit: Admitting: Pharmacist

## 2025-01-09 ENCOUNTER — Encounter: Payer: Self-pay | Admitting: Pharmacist

## 2025-01-09 DIAGNOSIS — E1069 Type 1 diabetes mellitus with other specified complication: Secondary | ICD-10-CM

## 2025-01-09 NOTE — Assessment & Plan Note (Signed)
 Assessment:  Given risk factors LDL goal: < 55 mg/dl last LDLc 91 mg/dl (98/7973) Intolerance to  statins - atorvastatin and pravastatin - muscle weakness  Discussed next potential options (Zetia PCSK-9 inhibitors, bempedoic acid and inclisiran); cost, dosing efficacy, side effects  Follows healthy diet and exercise regularly   Plan: Will apply for PA for PCSK9i; will inform patient upon approval (prefers MyChart message) Lipid lab due in 2-3 months after starting PCSK9i

## 2025-01-09 NOTE — Progress Notes (Signed)
 Patient ID: Spencer Burke                 DOB: 11-02-1961                    MRN: [id number]      HPI: Spencer Burke is a 64 y.o. male patient referred to lipid clinic by Dr.Harding. PMH is significant for T1DM, HDL, HTN, stage 3 CKD, hx of stroke elevated CAC score,  The patient has previously been treated with atorvastatin and pravastatin but was unable to tolerate either due to significant muscle weakness. He trialed both statins again following his stroke and reported a clear difference in muscle symptoms while on therapy versus off therapy, confirming statin intolerance. We reviewed evidence-based options for lowering LDL cholesterol, including ezetimibe, PCSK9 inhibitors, bempedoic acid, and inclisiran. We discussed each medications mechanism of action, dosing regimen, potential side effects, and expected LDL-cholesterol reductions. Cost considerations were also reviewed,  Current medication: none  Intolerances: atorvastatin and pravastatin  Risk Factors: T1DM, HDL, HTN, stage 3 CKD, hx of stroke elevated CAC score-1294 ,  LDL goal: <55 or <70  Last lipid lab 12/16/2024 TC 160, TG 100, HDLc 51, VLDLc 18, LDLc 91   Diet: on restricted diet due to CKD and T 1 DM    Exercise: cardio - 5 times per week   Family History:  Father- angioplasty in his early 56's, angina Mother: stokes, in her mid 18's, vascular dementia    Social History:  Alcohol: none smoking: never  Labs:  Lipid Panel     Component Value Date/Time   CHOL 160 12/16/2024 0843   TRIG 100 12/16/2024 0843   HDL 51 12/16/2024 0843   CHOLHDL 3.1 12/16/2024 0843   LDLCALC 91 12/16/2024 0843   LABVLDL 18 12/16/2024 0843    Past Medical History:  Diagnosis Date   ADD (attention deficit disorder)    Cervical stenosis of spine    DM (diabetes mellitus) type I controlled with renal manifestation (HCC)    Diagnosed at age 46: Complicated by CKD IIIb-IV, bilateral proliferative retinopathy and polyneuropathy;  proteinuria   Elevated coronary artery calcium score 01/24/2024   Coronary Calcium Score (12/22/2023): Total CAC 1284 (LAD 237, LCx 48, RCA 9 73) Stress PET (01/09/2024): Normal perfusion with no evidence of ischemia or infarction.  Rest EF 63%, stress EF 69% global myocardial flow reserve normal.  Severe calcification noted.  LOW RISK.    Essential hypertension 12/12/2023   Hyperlipidemia due to type 1 diabetes mellitus (HCC)    Right pontine stroke (HCC) 09/2019    Medications Ordered Prior to Encounter[1]  Allergies[2]  Assessment/Plan:  1. Hyperlipidemia -  Problem  Hyperlipidemia Due to Type 1 Diabetes Mellitus (Hcc)   Current medication: none  Intolerances: atorvastatin and pravastatin  Risk Factors: T1DM, HDL, HTN, stage 3 CKD, hx of stroke elevated CAC score-1294 ,  LDL goal: <55 or <70  Last lipid lab 12/16/2024 TC 160, TG 100, HDLc 51, VLDLc 18, LDLc 91     Hyperlipidemia due to type 1 diabetes mellitus (HCC) Assessment:  Given risk factors LDL goal: < 55 mg/dl last LDLc 91 mg/dl (98/7973) Intolerance to  statins - atorvastatin and pravastatin - muscle weakness  Discussed next potential options (Zetia PCSK-9 inhibitors, bempedoic acid and inclisiran); cost, dosing efficacy, side effects  Follows healthy diet and exercise regularly   Plan: Will apply for PA for PCSK9i; will inform patient upon approval (prefers MyChart message) Lipid lab due  in 2-3 months after starting PCSK9i    Thank you,  Robbi Blanch, Pharm.D Spencer Burke. [facility] [address], [place], [place] [address] Phone: [phone]; Fax: [phone]       [1]  Current Outpatient Medications on File Prior to Visit  Medication Sig Dispense Refill   aspirin 325 MG tablet Take 81 mg by mouth daily.     ASPIRIN PO Take 81 mg by mouth.     Cyanocobalamin 500 MCG SUBL Place under the tongue.     folic acid (FOLVITE) 1 MG tablet Take 1 mg by mouth  daily.     FUROSEMIDE PO Take by mouth. Prn swelling -- has not started yet     Glucagon (GVOKE HYPOPEN 2-PACK) 1 MG/0.2ML SOAJ Use per package direction in the event of extremely low blood sugar     glucose blood test strip CHECK BLOOD SUGAR 10-12 TIMES DAILY     insulin aspart (NOVOLOG) 100 UNIT/ML injection Inject into the skin 3 (three) times daily before meals. Use as directed for insulin pump.     Insulin Human (INSULIN PUMP) SOLN by Intravenous (Continuous Infusion) route. 5 units, Basal rate continous     LANTUS 100 UNIT/ML injection      losartan (COZAAR) 25 MG tablet Take 1 tablet by mouth daily.     mupirocin  ointment (BACTROBAN ) 2 % Apply 1 Application topically 2 (two) times daily. 22 g 0   sodium zirconium cyclosilicate (LOKELMA) 10 g PACK packet Take 10 g by mouth every other day.     thiamine (VITAMIN B1) 100 MG tablet Take by mouth.     No current facility-administered medications on file prior to visit.  [2]  Allergies Allergen Reactions   Amoxicillin Nausea And Vomiting   Atorvastatin     Statin myopathy    Pravastatin Sodium Other (See Comments)    Myalgia & weakness (also noted with Atorvastatin & Simvastatin)   Tetracyclines & Related
